# Patient Record
Sex: Male | Born: 1950 | Race: White | Hispanic: No | Marital: Married | Smoking: Former smoker
Health system: Southern US, Community
[De-identification: ages and names within clinical notes are randomized; demographics above are authoritative.]

## PROBLEM LIST (undated history)

## (undated) DIAGNOSIS — I10 Essential (primary) hypertension: Secondary | ICD-10-CM

## (undated) DIAGNOSIS — J449 Chronic obstructive pulmonary disease, unspecified: Secondary | ICD-10-CM

## (undated) HISTORY — PX: HERNIA REPAIR: SHX51

## (undated) HISTORY — PX: REPAIR OF PERFORATED ULCER: SHX6065

---

## 2016-05-07 ENCOUNTER — Inpatient Hospital Stay (HOSPITAL_COMMUNITY)
Admission: EM | Admit: 2016-05-07 | Discharge: 2016-05-11 | DRG: 469 | Disposition: A | Discharge status: Skilled Nursing Facility | Payer: Non-veteran care | Attending: Internal Medicine | Admitting: Internal Medicine

## 2016-05-07 ENCOUNTER — Emergency Department (HOSPITAL_COMMUNITY): Payer: Non-veteran care

## 2016-05-07 ENCOUNTER — Encounter (HOSPITAL_COMMUNITY): Payer: Self-pay | Admitting: *Deleted

## 2016-05-07 ENCOUNTER — Inpatient Hospital Stay (HOSPITAL_COMMUNITY): Payer: Non-veteran care

## 2016-05-07 DIAGNOSIS — Z419 Encounter for procedure for purposes other than remedying health state, unspecified: Secondary | ICD-10-CM

## 2016-05-07 DIAGNOSIS — J9601 Acute respiratory failure with hypoxia: Secondary | ICD-10-CM | POA: Diagnosis not present

## 2016-05-07 DIAGNOSIS — Z809 Family history of malignant neoplasm, unspecified: Secondary | ICD-10-CM

## 2016-05-07 DIAGNOSIS — S72011S Unspecified intracapsular fracture of right femur, sequela: Secondary | ICD-10-CM | POA: Diagnosis not present

## 2016-05-07 DIAGNOSIS — J449 Chronic obstructive pulmonary disease, unspecified: Secondary | ICD-10-CM | POA: Diagnosis present

## 2016-05-07 DIAGNOSIS — E871 Hypo-osmolality and hyponatremia: Secondary | ICD-10-CM | POA: Diagnosis present

## 2016-05-07 DIAGNOSIS — R000 Tachycardia, unspecified: Secondary | ICD-10-CM | POA: Diagnosis present

## 2016-05-07 DIAGNOSIS — S72011A Unspecified intracapsular fracture of right femur, initial encounter for closed fracture: Principal | ICD-10-CM | POA: Diagnosis present

## 2016-05-07 DIAGNOSIS — Y93K1 Activity, walking an animal: Secondary | ICD-10-CM | POA: Diagnosis not present

## 2016-05-07 DIAGNOSIS — F0390 Unspecified dementia without behavioral disturbance: Secondary | ICD-10-CM | POA: Diagnosis present

## 2016-05-07 DIAGNOSIS — Z79899 Other long term (current) drug therapy: Secondary | ICD-10-CM

## 2016-05-07 DIAGNOSIS — J439 Emphysema, unspecified: Secondary | ICD-10-CM | POA: Diagnosis not present

## 2016-05-07 DIAGNOSIS — Z7983 Long term (current) use of bisphosphonates: Secondary | ICD-10-CM | POA: Diagnosis not present

## 2016-05-07 DIAGNOSIS — Z8249 Family history of ischemic heart disease and other diseases of the circulatory system: Secondary | ICD-10-CM

## 2016-05-07 DIAGNOSIS — F17210 Nicotine dependence, cigarettes, uncomplicated: Secondary | ICD-10-CM | POA: Diagnosis present

## 2016-05-07 DIAGNOSIS — W010XXA Fall on same level from slipping, tripping and stumbling without subsequent striking against object, initial encounter: Secondary | ICD-10-CM | POA: Diagnosis present

## 2016-05-07 DIAGNOSIS — I10 Essential (primary) hypertension: Secondary | ICD-10-CM | POA: Diagnosis present

## 2016-05-07 DIAGNOSIS — I110 Hypertensive heart disease with heart failure: Secondary | ICD-10-CM | POA: Diagnosis present

## 2016-05-07 DIAGNOSIS — I509 Heart failure, unspecified: Secondary | ICD-10-CM | POA: Diagnosis present

## 2016-05-07 DIAGNOSIS — R413 Other amnesia: Secondary | ICD-10-CM | POA: Diagnosis not present

## 2016-05-07 DIAGNOSIS — Z96649 Presence of unspecified artificial hip joint: Secondary | ICD-10-CM

## 2016-05-07 DIAGNOSIS — E861 Hypovolemia: Secondary | ICD-10-CM | POA: Diagnosis present

## 2016-05-07 DIAGNOSIS — D62 Acute posthemorrhagic anemia: Secondary | ICD-10-CM | POA: Diagnosis not present

## 2016-05-07 HISTORY — DX: Essential (primary) hypertension: I10

## 2016-05-07 HISTORY — DX: Chronic obstructive pulmonary disease, unspecified: J44.9

## 2016-05-07 LAB — BASIC METABOLIC PANEL
ANION GAP: 11 (ref 5–15)
BUN: 11 mg/dL (ref 6–20)
CHLORIDE: 98 mmol/L — AB (ref 101–111)
CO2: 21 mmol/L — AB (ref 22–32)
Calcium: 9.1 mg/dL (ref 8.9–10.3)
Creatinine, Ser: 0.77 mg/dL (ref 0.61–1.24)
GFR calc Af Amer: >60 mL/min (ref >60)
GLUCOSE: 108 mg/dL — AB (ref 65–99)
POTASSIUM: 3.9 mmol/L (ref 3.5–5.1)
Sodium: 130 mmol/L — ABNORMAL LOW (ref 135–145)

## 2016-05-07 LAB — CBC WITH DIFFERENTIAL/PLATELET
BASOS ABS: 0 10*3/uL (ref 0.0–0.1)
Basophils Relative: 0 %
Eosinophils Absolute: 0 10*3/uL (ref 0.0–0.7)
Eosinophils Relative: 0 %
HEMATOCRIT: 38.2 % — AB (ref 39.0–52.0)
HEMOGLOBIN: 13.1 g/dL (ref 13.0–17.0)
LYMPHS ABS: 1.4 10*3/uL (ref 0.7–4.0)
LYMPHS PCT: 14 %
MCH: 31.9 pg (ref 26.0–34.0)
MCHC: 34.3 g/dL (ref 30.0–36.0)
MCV: 92.9 fL (ref 78.0–100.0)
Monocytes Absolute: 0.7 10*3/uL (ref 0.1–1.0)
Monocytes Relative: 7 %
NEUTROS ABS: 7.7 10*3/uL (ref 1.7–7.7)
Neutrophils Relative %: 79 %
Platelets: 226 10*3/uL (ref 150–400)
RBC: 4.11 MIL/uL — AB (ref 4.22–5.81)
RDW: 13.9 % (ref 11.5–15.5)
WBC: 9.8 10*3/uL (ref 4.0–10.5)

## 2016-05-07 MED ORDER — FENTANYL CITRATE (PF) 100 MCG/2ML IJ SOLN
50.0000 ug | Freq: Once | INTRAMUSCULAR | Status: AC
  Administered 2016-05-07: 50 ug via INTRAVENOUS
  Filled 2016-05-07: qty 2

## 2016-05-07 MED ORDER — MORPHINE SULFATE (PF) 2 MG/ML IV SOLN
0.5000 mg | INTRAVENOUS | Status: DC | PRN
  Filled 2016-05-07: qty 1

## 2016-05-07 MED ORDER — DOCUSATE SODIUM 100 MG PO CAPS
100.0000 mg | ORAL_CAPSULE | Freq: Two times a day (BID) | ORAL | Status: DC
  Administered 2016-05-08 – 2016-05-11 (×6): 100 mg via ORAL
  Filled 2016-05-07 (×6): qty 1

## 2016-05-07 MED ORDER — BISACODYL 10 MG RE SUPP: 10.0000 mg | Freq: Every day | RECTAL | Status: DC | PRN

## 2016-05-07 MED ORDER — HYDROMORPHONE HCL 1 MG/ML IJ SOLN
0.5000 mg | INTRAMUSCULAR | Status: DC | PRN
  Filled 2016-05-07: qty 1

## 2016-05-07 MED ORDER — MORPHINE SULFATE (PF) 2 MG/ML IV SOLN
2.0000 mg | INTRAVENOUS | Status: DC | PRN
  Administered 2016-05-07: 2 mg via INTRAVENOUS
  Filled 2016-05-07: qty 1

## 2016-05-07 MED ORDER — HYDROCODONE-ACETAMINOPHEN 5-325 MG PO TABS: 1.0000 | ORAL_TABLET | Freq: Four times a day (QID) | ORAL | Status: DC | PRN

## 2016-05-07 MED ORDER — OXYCODONE-ACETAMINOPHEN 5-325 MG PO TABS
1.0000 | ORAL_TABLET | Freq: Once | ORAL | Status: AC
  Administered 2016-05-07: 1 via ORAL

## 2016-05-07 MED ORDER — FENTANYL CITRATE (PF) 100 MCG/2ML IJ SOLN: 50.0000 ug | Freq: Once | INTRAMUSCULAR | Status: DC

## 2016-05-07 MED ORDER — TIOTROPIUM BROMIDE MONOHYDRATE 18 MCG IN CAPS
18.0000 ug | ORAL_CAPSULE | Freq: Every day | RESPIRATORY_TRACT | Status: DC
  Administered 2016-05-09 – 2016-05-11 (×3): 18 ug via RESPIRATORY_TRACT
  Filled 2016-05-07: qty 5

## 2016-05-07 MED ORDER — OXYCODONE-ACETAMINOPHEN 5-325 MG PO TABS
ORAL_TABLET | ORAL | Status: AC
  Filled 2016-05-07: qty 1

## 2016-05-07 MED ORDER — POLYETHYLENE GLYCOL 3350 17 G PO PACK: 17.0000 g | PACK | Freq: Every day | ORAL | Status: DC | PRN

## 2016-05-07 NOTE — ED Triage Notes (Signed)
Pt reports falling while walking the dog. Having right hip pain and multiple small abrasions to right hand and arm.

## 2016-05-07 NOTE — ED Provider Notes (Signed)
[facility]EMERGENCY DEPT Provider Note   CSN: [id number] Arrival date & time: 05/07/16  1630  First Provider Contact:  None       History   Chief Complaint Chief Complaint  Patient presents with  . Fall    HPI Julian Mejia is a 65 y.o. male.   Hip Pain  This is a new problem. The current episode started 3 to 5 hours ago. The problem occurs constantly. Progression since onset: comes and goes. Pertinent negatives include no chest pain, no abdominal pain, no headaches and no shortness of breath. The symptoms are aggravated by walking and standing (palpation). The symptoms are relieved by rest and narcotics. He has tried rest for the symptoms. The treatment provided mild relief.    Past Medical History:  Diagnosis Date  . COPD (chronic obstructive pulmonary disease) (HCC)   . Hypertension     Patient Active Problem List   Diagnosis Date Noted  . Memory loss 05/08/2016  . Hyponatremia 05/08/2016  . Closed subcapital fracture of neck of right femur (HCC) 05/07/2016  . Essential hypertension 05/07/2016  . Chronic obstructive pulmonary disease (COPD) (HCC) 05/07/2016    Past Surgical History:  Procedure Laterality Date  . HERNIA REPAIR    . REPAIR OF PERFORATED ULCER         Home Medications    Prior to Admission medications   Medication Sig Start Date End Date Taking? Authorizing Provider  albuterol (PROVENTIL HFA;VENTOLIN HFA) 108 (90 Base) MCG/ACT inhaler Inhale into the lungs every 6 (six) hours as needed for wheezing or shortness of breath.   Yes Historical Provider, MD  alendronate (FOSAMAX) 70 MG tablet Take 70 mg by mouth once a week. Take with a full glass of water on an empty stomach. 05/08/16  Yes Historical Provider, MD  amLODipine (NORVASC) 5 MG tablet Take 10 mg by mouth daily.   Yes Historical Provider, MD  cholecalciferol (VITAMIN D) 1000 units tablet Take 1,000 Units by mouth daily.   Yes Historical Provider, MD  donepezil (ARICEPT) 5 MG tablet Take 10 mg by  mouth daily.   Yes Historical Provider, MD  losartan (COZAAR) 50 MG tablet Take 50 mg by mouth daily.   Yes Historical Provider, MD  metoprolol (LOPRESSOR) 50 MG tablet Take 50 mg by mouth daily.   Yes Historical Provider, MD  tiotropium (SPIRIVA) 18 MCG inhalation capsule Place 18 mcg into inhaler and inhale daily.   Yes Historical Provider, MD    Family History Family History  Problem Relation Age of Onset  . Cancer Mother   . Coronary artery disease Father     Social History Social History  Substance Use Topics  . Smoking status: Current Every Day Smoker    Packs/day: 1.00    Types: Cigarettes  . Smokeless tobacco: Never Used  . Alcohol use Yes     Comment: daily     Allergies   Review of patient's allergies indicates no known allergies.   Review of Systems Review of Systems  Respiratory: Negative for shortness of breath.   Cardiovascular: Negative for chest pain.  Gastrointestinal: Negative for abdominal pain.  Neurological: Negative for headaches.     Physical Exam Updated Vital Signs BP 124/85 (BP Location: Left Arm)   Pulse 88   Temp 98.7 F (37.1 C) (Oral)   Resp 18   SpO2 98%   Physical Exam  Constitutional: He is oriented to person, place, and time. He appears well-developed. He appears distressed (occasionally has severe pain in  his R hip).  Thin pleasant  HENT:  Head: Normocephalic and atraumatic.  Eyes: Conjunctivae are normal. Right eye exhibits no discharge. Left eye exhibits no discharge.  Neck: Normal range of motion. Neck supple. No tracheal deviation present.  Cardiovascular: Normal rate, regular rhythm, normal heart sounds and intact distal pulses.  Exam reveals no gallop and no friction rub.   No murmur heard. Pulmonary/Chest: Effort normal. No stridor. No respiratory distress. He has wheezes (mild scattered wheezing). He has no rales.  Abdominal: Soft. Bowel sounds are normal. He exhibits no distension. There is no tenderness. There is  no rebound and no guarding.  Musculoskeletal: He exhibits tenderness (to r hip. pelvis appears stable though). He exhibits no edema or deformity.  Foot was in neutral position.  R leg is mildly shortened and ext rotated  Neurological: He is alert and oriented to person, place, and time.  MAEI Movement in RLE is limited due to pain in hip  Skin: Skin is warm and dry. Capillary refill takes less than 2 seconds. He is not diaphoretic.  Psychiatric: He has a normal mood and affect. His behavior is normal. Thought content normal.  Nursing note and vitals reviewed.    ED Treatments / Results  Labs (all labs ordered are listed, but only abnormal results are displayed) Labs Reviewed  CBC WITH DIFFERENTIAL/PLATELET - Abnormal; Notable for the following:       Result Value   RBC 4.11 (*)    HCT 38.2 (*)    All other components within normal limits  BASIC METABOLIC PANEL - Abnormal; Notable for the following:    Sodium 130 (*)    Chloride 98 (*)    CO2 21 (*)    Glucose, Bld 108 (*)    All other components within normal limits  CBC - Abnormal; Notable for the following:    RBC 4.18 (*)    HCT 38.8 (*)    All other components within normal limits  BASIC METABOLIC PANEL - Abnormal; Notable for the following:    Sodium 134 (*)    Glucose, Bld 111 (*)    All other components within normal limits  SURGICAL PCR SCREEN  SODIUM, URINE, RANDOM  OSMOLALITY, URINE  OSMOLALITY  VITAMIN D 25 HYDROXY (VIT D DEFICIENCY, FRACTURES)  TYPE AND SCREEN  ABO/RH    EKG  EKG Interpretation  Date/Time:  Thursday May 07 2016 22:56:52 EDT Ventricular Rate:  86 PR Interval:    QRS Duration: 84 QT Interval:  347 QTC Calculation: 415 R Axis:   -141 Text Interpretation:  Sinus rhythm Biatrial enlargement Right axis deviation No prior EKG for comparison  Confirmed by LIU MD, DANA [phone]) on 05/07/2016 11:27:25 PM       Radiology Chest Portable 1 View  Result Date: 05/07/2016 CLINICAL DATA:   66 year old male preoperative study for hip fracture. Initial encounter. COPD EXAM: PORTABLE CHEST 1 VIEW COMPARISON:  07/27/2009. FINDINGS: Portable AP semi upright view at 2256 hours. Increased and moderate to severe tortuosity of the thoracic aorta. Other mediastinal contours are within normal limits. Visualized tracheal air column is within normal limits. Allowing for portable technique, the lungs are clear. Chronic hyperinflation. No pneumothorax or pleural effusion. IMPRESSION: 1.  No acute cardiopulmonary abnormality.  Pulmonary hyperinflation. 2. Significantly progressed tortuosity of the thoracic aorta since 2010. Consider non-emergent imaging followup by CTA or MRA. Electronically Signed   By: Odessa Fleming M.D.   On: 05/07/2016 23:10   Dg Hip Unilat  With Pelvis  2-3 Views Right  Result Date: 05/07/2016 CLINICAL DATA:  Initial evaluation for acute trauma, fall. Acute right-sided hip pain. EXAM: DG HIP (WITH OR WITHOUT PELVIS) 2-3V RIGHT COMPARISON:  None. FINDINGS: There is an acute subcapital fracture through the right femoral neck with slight impaction. Femoral head remains normally position within the acetabulum. Visualized right femur are otherwise intact. Bony pelvis intact without associated fracture. The SI joints approximated. Limited views of the left hip grossly unremarkable. 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 Scattered vascular calcifications noted within the proximal thighs. IMPRESSION: Acute subcapital fracture of the right femoral neck with slight impaction. Electronically Signed   By: Rise Mu M.D.   On: 05/07/2016 19:18    Procedures Procedures (including critical care time)  Medications Ordered in ED Medications  fentaNYL (SUBLIMAZE) injection 50 mcg (not administered)  oxyCODONE-acetaminophen (PERCOCET/ROXICET) 5-325 MG per tablet 1 tablet (1 tablet Oral Given 05/07/16 1701)     Initial Impression / Assessment and Plan / ED Course  I  have reviewed the triage vital signs and the nursing notes.  Pertinent labs & imaging results that were available during my care of the patient were reviewed by me and considered in my medical decision making (see chart for details).  Clinical Course   65 year old male who presents after a mechanical fall while walking his dog. Denies any preceding SOB/CP/syncope. Past medical history includes smoking and hypertension. Patient had immediate pain along his right proximal femur/right hip. Abrasions to his digits but otherwise no other pain or visible trauma. Exquisitely tender to palpation and worse with movement. Patient is neurovascularly intact distally. X-ray of his right hip was obtained and revealed a closed subcapital fracture of the neck of the right femur. Analgesia was also provided initially with fentanyl due to soft blood pressures, however with transition to a more long-acting medication including Percocet and morphine for breakthrough once blood pressure improved. Orthopedics was consulted and they recommended admission with appropriate preop screening in addition to making the patient NPO at midnight for possible surgical repair in the morning. Discussed case with the hospitalist team. Patient and wife updated at bedside. Agree with plan. All questions answered.  Final Clinical Impressions(s) / ED Diagnoses   Final diagnoses:  COPD (chronic obstructive pulmonary disease) (HCC)  Closed subcapital fracture of neck of right femur   New Prescriptions New Prescriptions   No medications on file      Maretta Bees, MD 05/08/16 2863    Lavera Guise, MD 05/08/16 1102

## 2016-05-07 NOTE — ED Notes (Signed)
MD at bedside. 

## 2016-05-07 NOTE — ED Notes (Signed)
XR at bedside

## 2016-05-07 NOTE — H&P (Signed)
History and Physical  Patient Name: Julian Mejia     YSA:[id number]    DOB: 08-28-1951    DOA: 05/07/2016 Referring provider: Glee Arvin, MD PCP: "Dr. Virgia Land" or "Dr. Jeri Lager" at the [facility]  Patient coming from: Home     Chief Complaint: Hip pain and fall  HPI: Julian Mejia is a 65 y.o. male with a past medical history significant for COPD, HTN, early dementia, and smoking who presents with hip pain after a fall.  The patient was in his normal state of health until this evening when he was walking the dog, was pulled down, and had immediate RIGHT hip pain and could not walk.  A neighbor helped him onto or into his truck, then his wife drove him to the [place] where they said he needed to go to a regular hospital, and so he came here.  In the ED, the patient was slightly tachycardic, but otherwise stable, complained of RIGHT hip pain severe and a plain radiograph of the pelvis showed a acute subcapital fracture of the right femoral neck.  The case was discussed with Dr. Linwood Dibbles who agreed to see the patient, and TR[facility] were asked to admit for medical management.    There was no preceding dizziness, weakness, lightheadedness or vertigo, no chest discomfort, palpitations, or dyspnea, nor are there any of those symptoms now.  The patient denies active heart issues, angina, or history of MI. He is able to exert to an equivalent of 4 METS without dyspnea.  He denies history of TIAs/CVAs, CAD, CHF, or DM treated with insulin. He has COPD on tiotropium, but has never been hospitalized for COPD or been intubated.  He is an active smoker.  She has no history of prolonged steroid use and does not use insulin.  He uses alcohol daily, 3-4 beers "or whatever it is".  Anesthesia Specific concerns: Presence of loose teeth: None Anesthesia problems in past: None History of bleeding disorder: None  Review of Systems:  Pt complains of RIGHT hip pain with movement. All other systems negative except as just noted or  noted in the history of present illness.        Past Medical History:  Diagnosis Date  . COPD (chronic obstructive pulmonary disease) (HCC)   . Hypertension     Past Surgical History:  Procedure Laterality Date  . HERNIA REPAIR    . REPAIR OF PERFORATED ULCER      Social History: Patient lives with his wife.  Patient walks unassisted. 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  From [place] area.  Still smokes.  Drinks several beers daily.     No Known Allergies  Family history: family history includes Cancer in his mother; Coronary artery disease in his father.  Prior to Admission medications   Not on File  Wife reports he takes "three blood pressure medicines", but not sure what they are.   Also takes tiotropium daily and Proair as needed.   Also takes a "medicine for memory"     Physical Exam: BP 122/88   Pulse 87   Temp 98.7 F (37.1 C) (Oral)   Resp 18   SpO2 91%  General appearance: Very thin adult male, alert and in no acute distress.   Eyes: Anicteric, conjunctiva pink, lids and lashes normal.     ENT: No nasal deformity, discharge, or epistaxis.  OP moist without lesions.   Skin: Warm and dry.  No jaundice.  No suspicious rashes or lesions. Cardiac: RRR, nl S1-S2, no murmurs appreciated.  Capillary refill is brisk.  JVP normal.  No LE edema.  Radial and DP pulses 2+ and symmetric. Respiratory: Normal respiratory rate and rhythm.  CTAB without rales, some rhonchi, mild. GI: Abdomen soft without rigidity.  No TTP. No ascites, distension.   MSK: Right leg is shortened and externally rotated and painful. Neuro: Cranial nerves normal.  Sensorium intact and responding to questions, attention normal.  Speech is fluent.  Moves upper extremities equally and with normal coordination.   Memory and recall seem slightly impaired. Psych: Behavior appropriate.  Affect normal, pleasant.  No evidence of aural or visual hallucinations or  delusions.       Labs on Admission:  I have personally reviewed following labs and imaging studies: CBC:  Recent Labs Lab 05/07/16 2250 05/08/16 0026  WBC 9.8 8.7  NEUTROABS 7.7  --   HGB 13.1 13.2  HCT 38.2* 38.8*  MCV 92.9 92.8  PLT 226 234   Basic Metabolic Panel:  Recent Labs Lab 05/07/16 2250 05/08/16 0026  NA 130* 134*  K 3.9 3.9  CL 98* 101  CO2 21* 23  GLUCOSE 108* 111*  BUN 11 11  CREATININE 0.77 0.78  CALCIUM 9.1 9.3   GFR: CrCl cannot be calculated (Unknown ideal weight.).    Radiological Exams on Admission: Personally reviewed: Chest Portable 1 View  Result Date: 05/07/2016 CLINICAL DATA:  65 year old male preoperative study for hip fracture. Initial encounter. COPD EXAM: PORTABLE CHEST 1 VIEW COMPARISON:  07/27/2009. FINDINGS: Portable AP semi upright view at 2256 hours. Increased and moderate to severe tortuosity of the thoracic aorta. Other mediastinal contours are within normal limits. Visualized tracheal air column is within normal limits. Allowing for portable technique, the lungs are clear. Chronic hyperinflation. No pneumothorax or pleural effusion. IMPRESSION: 1.  No acute cardiopulmonary abnormality.  Pulmonary hyperinflation. 2. Significantly progressed tortuosity of the thoracic aorta since 2010. Consider non-emergent imaging followup by CTA or MRA. Electronically Signed   By: Odessa Fleming M.D.   On: 05/07/2016 23:10   Dg Hip Unilat  With Pelvis 2-3 Views Right  Result Date: 05/07/2016 CLINICAL DATA:  Initial evaluation for acute trauma, fall. Acute right-sided hip pain. EXAM: DG HIP (WITH OR WITHOUT PELVIS) 2-3V RIGHT COMPARISON:  None. FINDINGS: There is an acute subcapital fracture through the right femoral neck with slight impaction. Femoral head remains normally position within the acetabulum. Visualized right femur are otherwise intact. Bony pelvis intact without associated fracture. The SI joints approximated. Limited views of the left hip grossly  unremarkable. 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 Scattered vascular calcifications noted within the proximal thighs. IMPRESSION: Acute subcapital fracture of the right femoral neck with slight impaction. Electronically Signed   By: Rise Mu M.D.   On: 05/07/2016 19:18    EKG: Independently reviewed. Rate 86, QTc 415, right axis, incomplete RBBB.    Assessment and Plan: 1. Hip fracture: The patient will be seen by Dr. Roda Shutters in the morning at [facility], to evaluate for operative fixation of the RIGHT hip.   -Admit to med-surg bed -Hydrocodone-acetaminophen or morphine as tolerated for pain -Bed rest, apply ice, document sedation and vitals per Hip fracture protocol -NPO at midnight -MIVF -Nutrition consulted    Overall, the patient is at low risk for the planned surgery.  Patient has a RCRI score of 0 (active cardiac condition, CHF, CAD, DM treated with insulin, TIA/CVA, Cr > 2.0). The  patient has no active cardiac symptoms, a functional capacity >4 METs, and would be expected to be at average risk for cardiac complications from this intermediate risk procedure. -No further testing needed  Pulmonary: Patient has COPD and is an active smoker.  CXR shows emphysema but he is not hypoxic nor in respiratory distress. Patient is at moderate risk for pulmonary complications.  -Continue tiotropium -Albuterol PRN  Endocrine: Patient has no history of steroid use or DM.  Heme: Transfusion threshold 8 mg/dL.   2. HTN: Unclear what medicines patient takes.  Normotensive now.  Ideally would continue beta-blocker peri-operatively.  -I have asked the patient's wife to go home and call me tonight with his medication list, otherwise, she will return with them tomorrow   Post-operative medical care: Per AAOS 2014 guidelines on hip fractures in the elderly: -Recommend osteoporosis screening after discharge if not done previously -Recommend vitamin D 800 IUand  calcium 1200 mg supplementation after discharge    3. Hyponatremia: Previous level low, likely hypovolemic. -Check free water clearance and urine sodium  4. Aortic tortuosity: Will confirm PCP name (wife thought it was "[place]" or "Jeri Lager" when she returns call with medication list -Follow up with CTA or MR imaging as outpatient per Radiology  5. Alcohol use: Unclear extent. -CIWA protocol perioperatively            DVT prophylaxis: SCDs  Diet: NPO Code Status: FULL  Family Communication: Wife  Disposition Plan: Anticipate evaluation by Orthopedics and likely surgical fixation tomorrow, then PT evaluation and discharge to SNF in 2-3 days. Admission status: INPATIENT for hip fracture, medical surgical bed     Medical decision making and consults: Patient seen at 10:00 PM on 05/07/2016.  The patient was discussed with Dr. Verdie Mosher. Clinical condition: stable.      Alberteen Sam [facility] Pager [phone]

## 2016-05-07 NOTE — ED Notes (Signed)
Attempt to call report unsuccessful at this time. Nurse requested to be called back in fifteen minutes.

## 2016-05-07 NOTE — ED Provider Notes (Signed)
I saw and evaluated the patient, reviewed the resident's note and I agree with the findings and plan.   EKG Interpretation None       I have independently reviewed the following tracings and/or images and used them in my medical decision making: XR right hip and pelvis   65 year old male who presents after mechanical fall. No blood thinners. No significant PMH. His dog pulled on the leash today during a walk, and he fell onto the right hip. Unable to stand or ambulate after fall. No headstrike or LOC. No chest pain, abd pain, neck or back pain. C/o right hip pain. Neurovascularly in tact RLE. No other injuries noted on exam aside from right hip tenderness. XR with right femoral neck fracture. Dr. Roda Shutters will plan to operate tomorrow. Admitted to Dr. Maryfrances Bunnell for ongoing management.      Lavera Guise, MD 05/07/16 [phone]

## 2016-05-08 ENCOUNTER — Encounter (HOSPITAL_COMMUNITY)
Admission: EM | Disposition: A | Discharge status: Skilled Nursing Facility | Payer: Self-pay | Source: Home / Self Care | Attending: Internal Medicine

## 2016-05-08 ENCOUNTER — Encounter (HOSPITAL_COMMUNITY): Payer: Self-pay

## 2016-05-08 ENCOUNTER — Inpatient Hospital Stay (HOSPITAL_COMMUNITY): Payer: Non-veteran care

## 2016-05-08 ENCOUNTER — Inpatient Hospital Stay (HOSPITAL_COMMUNITY): Payer: Non-veteran care | Admitting: Certified Registered Nurse Anesthetist

## 2016-05-08 DIAGNOSIS — E871 Hypo-osmolality and hyponatremia: Secondary | ICD-10-CM | POA: Diagnosis present

## 2016-05-08 DIAGNOSIS — R413 Other amnesia: Secondary | ICD-10-CM | POA: Diagnosis present

## 2016-05-08 DIAGNOSIS — S72011S Unspecified intracapsular fracture of right femur, sequela: Secondary | ICD-10-CM

## 2016-05-08 HISTORY — PX: TOTAL HIP ARTHROPLASTY: SHX124

## 2016-05-08 LAB — BASIC METABOLIC PANEL
Anion gap: 10 (ref 5–15)
BUN: 11 mg/dL (ref 6–20)
CALCIUM: 9.3 mg/dL (ref 8.9–10.3)
CO2: 23 mmol/L (ref 22–32)
CREATININE: 0.78 mg/dL (ref 0.61–1.24)
Chloride: 101 mmol/L (ref 101–111)
GFR calc Af Amer: >60 mL/min (ref >60)
GLUCOSE: 111 mg/dL — AB (ref 65–99)
Potassium: 3.9 mmol/L (ref 3.5–5.1)
Sodium: 134 mmol/L — ABNORMAL LOW (ref 135–145)

## 2016-05-08 LAB — OSMOLALITY: OSMOLALITY: 282 mosm/kg (ref 275–295)

## 2016-05-08 LAB — CBC
HCT: 38.8 % — ABNORMAL LOW (ref 39.0–52.0)
Hemoglobin: 13.2 g/dL (ref 13.0–17.0)
MCH: 31.6 pg (ref 26.0–34.0)
MCHC: 34 g/dL (ref 30.0–36.0)
MCV: 92.8 fL (ref 78.0–100.0)
PLATELETS: 234 10*3/uL (ref 150–400)
RBC: 4.18 MIL/uL — AB (ref 4.22–5.81)
RDW: 14.1 % (ref 11.5–15.5)
WBC: 8.7 10*3/uL (ref 4.0–10.5)

## 2016-05-08 LAB — TYPE AND SCREEN
ABO/RH(D): A NEG
Antibody Screen: NEGATIVE

## 2016-05-08 LAB — SURGICAL PCR SCREEN
MRSA, PCR: NEGATIVE
STAPHYLOCOCCUS AUREUS: NEGATIVE

## 2016-05-08 LAB — SODIUM, URINE, RANDOM: Sodium, Ur: 74 mmol/L

## 2016-05-08 LAB — ABO/RH: ABO/RH(D): A NEG

## 2016-05-08 LAB — OSMOLALITY, URINE: Osmolality, Ur: 582 mOsm/kg (ref 300–900)

## 2016-05-08 SURGERY — ARTHROPLASTY, HIP, TOTAL, ANTERIOR APPROACH
Anesthesia: Spinal | Laterality: Right

## 2016-05-08 MED ORDER — THIAMINE HCL 100 MG/ML IJ SOLN: 100.0000 mg | Freq: Every day | INTRAMUSCULAR | Status: DC

## 2016-05-08 MED ORDER — METOCLOPRAMIDE HCL 5 MG PO TABS
5.0000 mg | ORAL_TABLET | Freq: Three times a day (TID) | ORAL | Status: DC | PRN
Start: 2016-05-08 — End: 2016-05-11

## 2016-05-08 MED ORDER — VITAMIN B-1 100 MG PO TABS
100.0000 mg | ORAL_TABLET | Freq: Every day | ORAL | Status: DC
Start: 2016-05-08 — End: 2016-05-11
  Administered 2016-05-09 – 2016-05-11 (×3): 100 mg via ORAL
  Filled 2016-05-08 (×3): qty 1

## 2016-05-08 MED ORDER — SODIUM CHLORIDE 0.9 % IV SOLN
INTRAVENOUS | Status: DC
  Administered 2016-05-08 – 2016-05-09 (×4): via INTRAVENOUS

## 2016-05-08 MED ORDER — PROPOFOL 500 MG/50ML IV EMUL
INTRAVENOUS | Status: DC | PRN
  Administered 2016-05-08: 50 ug/kg/min via INTRAVENOUS

## 2016-05-08 MED ORDER — PHENYLEPHRINE 40 MCG/ML (10ML) SYRINGE FOR IV PUSH (FOR BLOOD PRESSURE SUPPORT)
PREFILLED_SYRINGE | INTRAVENOUS | Status: AC
  Filled 2016-05-08: qty 10

## 2016-05-08 MED ORDER — BUPIVACAINE LIPOSOME 1.3 % IJ SUSP
20.0000 mL | Freq: Once | INTRAMUSCULAR | Status: DC
  Filled 2016-05-08: qty 20

## 2016-05-08 MED ORDER — PHENYLEPHRINE HCL 10 MG/ML IJ SOLN
INTRAMUSCULAR | Status: DC | PRN
  Administered 2016-05-08: 80 ug via INTRAVENOUS

## 2016-05-08 MED ORDER — MIDAZOLAM HCL 2 MG/2ML IJ SOLN
INTRAMUSCULAR | Status: AC
  Filled 2016-05-08: qty 2

## 2016-05-08 MED ORDER — ALBUTEROL SULFATE (2.5 MG/3ML) 0.083% IN NEBU
INHALATION_SOLUTION | RESPIRATORY_TRACT | Status: AC
  Filled 2016-05-08: qty 3

## 2016-05-08 MED ORDER — LIDOCAINE HCL (CARDIAC) 20 MG/ML IV SOLN
INTRAVENOUS | Status: DC | PRN
  Administered 2016-05-08: 40 mg via INTRAVENOUS

## 2016-05-08 MED ORDER — ALBUTEROL SULFATE (2.5 MG/3ML) 0.083% IN NEBU
2.5000 mg | INHALATION_SOLUTION | RESPIRATORY_TRACT | Status: DC | PRN
  Administered 2016-05-08: 2.5 mg via RESPIRATORY_TRACT

## 2016-05-08 MED ORDER — ONDANSETRON HCL 4 MG/2ML IJ SOLN: 4.0000 mg | Freq: Once | INTRAMUSCULAR | Status: DC | PRN

## 2016-05-08 MED ORDER — LORAZEPAM 1 MG PO TABS: 1.0000 mg | ORAL_TABLET | Freq: Four times a day (QID) | ORAL | Status: AC | PRN

## 2016-05-08 MED ORDER — HYDROCODONE-ACETAMINOPHEN 5-325 MG PO TABS: 1.0000 | ORAL_TABLET | Freq: Four times a day (QID) | ORAL | Status: DC | PRN

## 2016-05-08 MED ORDER — ENOXAPARIN SODIUM 40 MG/0.4ML ~~LOC~~ SOLN: 40.0000 mg | Freq: Every day | SUBCUTANEOUS | 0 refills | Status: DC

## 2016-05-08 MED ORDER — METOCLOPRAMIDE HCL 5 MG/ML IJ SOLN
5.0000 mg | Freq: Three times a day (TID) | INTRAMUSCULAR | Status: DC | PRN
Start: 2016-05-08 — End: 2016-05-11

## 2016-05-08 MED ORDER — ACETAMINOPHEN 325 MG PO TABS
650.0000 mg | ORAL_TABLET | Freq: Four times a day (QID) | ORAL | Status: DC | PRN
  Administered 2016-05-10: 650 mg via ORAL
  Filled 2016-05-08: qty 2

## 2016-05-08 MED ORDER — ONDANSETRON HCL 4 MG/2ML IJ SOLN
INTRAMUSCULAR | Status: AC
  Filled 2016-05-08: qty 2

## 2016-05-08 MED ORDER — LORAZEPAM 2 MG/ML IJ SOLN: 1.0000 mg | Freq: Four times a day (QID) | INTRAMUSCULAR | Status: AC | PRN

## 2016-05-08 MED ORDER — LACTATED RINGERS IV SOLN
INTRAVENOUS | Status: DC
  Administered 2016-05-08 (×2): via INTRAVENOUS

## 2016-05-08 MED ORDER — SODIUM CHLORIDE 0.9 % IV SOLN
INTRAVENOUS | Status: DC | PRN
  Administered 2016-05-08: 20 ug/min via INTRAVENOUS

## 2016-05-08 MED ORDER — CEFAZOLIN SODIUM-DEXTROSE 2-3 GM-% IV SOLR: INTRAVENOUS | Status: DC | PRN

## 2016-05-08 MED ORDER — ENOXAPARIN SODIUM 40 MG/0.4ML ~~LOC~~ SOLN
40.0000 mg | SUBCUTANEOUS | Status: DC
  Administered 2016-05-09 – 2016-05-11 (×3): 40 mg via SUBCUTANEOUS
  Filled 2016-05-08 (×3): qty 0.4

## 2016-05-08 MED ORDER — DONEPEZIL HCL 10 MG PO TABS
10.0000 mg | ORAL_TABLET | Freq: Every day | ORAL | Status: DC
  Administered 2016-05-09 – 2016-05-10 (×3): 10 mg via ORAL
  Filled 2016-05-08 (×3): qty 1

## 2016-05-08 MED ORDER — POVIDONE-IODINE 10 % EX SWAB: 2.0000 "application " | Freq: Once | CUTANEOUS | Status: DC

## 2016-05-08 MED ORDER — BUPIVACAINE HCL (PF) 0.5 % IJ SOLN
INTRAMUSCULAR | Status: DC | PRN
  Administered 2016-05-08: 12.5 mg via INTRATHECAL

## 2016-05-08 MED ORDER — ADULT MULTIVITAMIN W/MINERALS CH
1.0000 | ORAL_TABLET | Freq: Every day | ORAL | Status: DC
  Administered 2016-05-09 – 2016-05-11 (×3): 1 via ORAL
  Filled 2016-05-08 (×3): qty 1

## 2016-05-08 MED ORDER — DIPHENHYDRAMINE HCL 25 MG PO CAPS
25.0000 mg | ORAL_CAPSULE | Freq: Three times a day (TID) | ORAL | Status: DC | PRN
  Administered 2016-05-08: 25 mg via ORAL
  Filled 2016-05-08: qty 1

## 2016-05-08 MED ORDER — CEFAZOLIN SODIUM 1 G IJ SOLR
INTRAMUSCULAR | Status: DC | PRN
  Administered 2016-05-08: 2 g via INTRAMUSCULAR

## 2016-05-08 MED ORDER — METHOCARBAMOL 500 MG PO TABS
500.0000 mg | ORAL_TABLET | Freq: Four times a day (QID) | ORAL | Status: DC | PRN
  Administered 2016-05-08 – 2016-05-09 (×3): 500 mg via ORAL
  Filled 2016-05-08 (×4): qty 1

## 2016-05-08 MED ORDER — TRANEXAMIC ACID 1000 MG/10ML IV SOLN
2000.0000 mg | Freq: Once | INTRAVENOUS | Status: DC
  Filled 2016-05-08: qty 20

## 2016-05-08 MED ORDER — CEFAZOLIN SODIUM-DEXTROSE 2-4 GM/100ML-% IV SOLN: 2.0000 g | INTRAVENOUS | Status: DC

## 2016-05-08 MED ORDER — AMLODIPINE BESYLATE 10 MG PO TABS
10.0000 mg | ORAL_TABLET | Freq: Every day | ORAL | Status: DC
  Administered 2016-05-09 – 2016-05-11 (×3): 10 mg via ORAL
  Filled 2016-05-08 (×3): qty 1

## 2016-05-08 MED ORDER — FENTANYL CITRATE (PF) 100 MCG/2ML IJ SOLN
INTRAMUSCULAR | Status: DC | PRN
  Administered 2016-05-08: 50 ug via INTRAVENOUS

## 2016-05-08 MED ORDER — FENTANYL CITRATE (PF) 250 MCG/5ML IJ SOLN
INTRAMUSCULAR | Status: AC
  Filled 2016-05-08: qty 5

## 2016-05-08 MED ORDER — OXYCODONE HCL 5 MG PO TABS: 5.0000 mg | ORAL_TABLET | ORAL | 0 refills | Status: DC | PRN

## 2016-05-08 MED ORDER — ONDANSETRON HCL 4 MG/2ML IJ SOLN: 4.0000 mg | Freq: Four times a day (QID) | INTRAMUSCULAR | Status: DC | PRN

## 2016-05-08 MED ORDER — PROPOFOL 10 MG/ML IV BOLUS
INTRAVENOUS | Status: DC | PRN
  Administered 2016-05-08: 20 mg via INTRAVENOUS
  Administered 2016-05-08: 10 mg via INTRAVENOUS

## 2016-05-08 MED ORDER — FOLIC ACID 1 MG PO TABS
1.0000 mg | ORAL_TABLET | Freq: Every day | ORAL | Status: DC
  Administered 2016-05-09 – 2016-05-11 (×3): 1 mg via ORAL
  Filled 2016-05-08 (×3): qty 1

## 2016-05-08 MED ORDER — METOPROLOL TARTRATE 50 MG PO TABS
50.0000 mg | ORAL_TABLET | Freq: Every day | ORAL | Status: DC
  Administered 2016-05-09 – 2016-05-11 (×3): 50 mg via ORAL
  Filled 2016-05-08 (×3): qty 1

## 2016-05-08 MED ORDER — ONDANSETRON HCL 4 MG PO TABS: 4.0000 mg | ORAL_TABLET | Freq: Four times a day (QID) | ORAL | Status: DC | PRN

## 2016-05-08 MED ORDER — ALUM & MAG HYDROXIDE-SIMETH 200-200-20 MG/5ML PO SUSP: 30.0000 mL | ORAL | Status: DC | PRN

## 2016-05-08 MED ORDER — SODIUM CHLORIDE 0.9 % IN NEBU
INHALATION_SOLUTION | RESPIRATORY_TRACT | Status: AC
  Filled 2016-05-08: qty 3

## 2016-05-08 MED ORDER — SODIUM CHLORIDE 0.9 % IV SOLN
INTRAVENOUS | Status: DC | PRN
  Administered 2016-05-08: 2000 mg via TOPICAL

## 2016-05-08 MED ORDER — DEXTROSE 5 % IV SOLN: 3.0000 g | INTRAVENOUS | Status: DC

## 2016-05-08 MED ORDER — METHOCARBAMOL 1000 MG/10ML IJ SOLN
500.0000 mg | Freq: Four times a day (QID) | INTRAVENOUS | Status: DC | PRN
  Filled 2016-05-08: qty 5

## 2016-05-08 MED ORDER — VANCOMYCIN HCL 10 G IV SOLR: 1500.0000 mg | INTRAVENOUS | Status: DC

## 2016-05-08 MED ORDER — PROPOFOL 10 MG/ML IV BOLUS
INTRAVENOUS | Status: AC
  Filled 2016-05-08: qty 20

## 2016-05-08 MED ORDER — LIDOCAINE 2% (20 MG/ML) 5 ML SYRINGE
INTRAMUSCULAR | Status: AC
  Filled 2016-05-08: qty 5

## 2016-05-08 MED ORDER — SODIUM CHLORIDE 0.9 % IR SOLN
Status: DC | PRN
  Administered 2016-05-08: 1000 mL
  Administered 2016-05-08: 3000 mL

## 2016-05-08 MED ORDER — MORPHINE SULFATE (PF) 2 MG/ML IV SOLN
0.5000 mg | INTRAVENOUS | Status: DC | PRN
  Administered 2016-05-08: 0.5 mg via INTRAVENOUS

## 2016-05-08 MED ORDER — LOSARTAN POTASSIUM 50 MG PO TABS
50.0000 mg | ORAL_TABLET | Freq: Every day | ORAL | Status: DC
  Administered 2016-05-09 – 2016-05-11 (×3): 50 mg via ORAL
  Filled 2016-05-08 (×4): qty 1

## 2016-05-08 MED ORDER — MENTHOL 3 MG MT LOZG: 1.0000 | LOZENGE | OROMUCOSAL | Status: DC | PRN

## 2016-05-08 MED ORDER — 0.9 % SODIUM CHLORIDE (POUR BTL) OPTIME
TOPICAL | Status: DC | PRN
  Administered 2016-05-08: 1000 mL

## 2016-05-08 MED ORDER — TRANEXAMIC ACID 1000 MG/10ML IV SOLN
1000.0000 mg | INTRAVENOUS | Status: AC
  Administered 2016-05-08: 1000 mg via INTRAVENOUS
  Filled 2016-05-08: qty 10

## 2016-05-08 MED ORDER — CEFAZOLIN SODIUM-DEXTROSE 2-4 GM/100ML-% IV SOLN
2.0000 g | Freq: Four times a day (QID) | INTRAVENOUS | Status: AC
  Administered 2016-05-08 – 2016-05-09 (×3): 2 g via INTRAVENOUS
  Filled 2016-05-08 (×3): qty 100

## 2016-05-08 MED ORDER — FENTANYL CITRATE (PF) 100 MCG/2ML IJ SOLN: 25.0000 ug | INTRAMUSCULAR | Status: DC | PRN

## 2016-05-08 MED ORDER — VANCOMYCIN HCL IN DEXTROSE 1-5 GM/200ML-% IV SOLN: 1000.0000 mg | INTRAVENOUS | Status: DC

## 2016-05-08 MED ORDER — ACETAMINOPHEN 650 MG RE SUPP: 650.0000 mg | Freq: Four times a day (QID) | RECTAL | Status: DC | PRN

## 2016-05-08 MED ORDER — OXYCODONE HCL 5 MG PO TABS
5.0000 mg | ORAL_TABLET | ORAL | Status: DC | PRN
  Administered 2016-05-08 – 2016-05-09 (×6): 10 mg via ORAL
  Administered 2016-05-11: 5 mg via ORAL
  Filled 2016-05-08 (×8): qty 2

## 2016-05-08 MED ORDER — ENSURE ENLIVE PO LIQD
237.0000 mL | Freq: Two times a day (BID) | ORAL | Status: DC
  Administered 2016-05-09 – 2016-05-11 (×5): 237 mL via ORAL
  Filled 2016-05-08: qty 237

## 2016-05-08 MED ORDER — PHENOL 1.4 % MT LIQD: 1.0000 | OROMUCOSAL | Status: DC | PRN

## 2016-05-08 SURGICAL SUPPLY — 50 items
BAG DECANTER FOR FLEXI CONT (MISCELLANEOUS) ×3 IMPLANT
CAPT HIP TOTAL 2 ×3 IMPLANT
CELLS DAT CNTRL 66122 CELL SVR (MISCELLANEOUS) IMPLANT
COVER SURGICAL LIGHT HANDLE (MISCELLANEOUS) ×3 IMPLANT
DRAPE C-ARM 42X72 X-RAY (DRAPES) ×3 IMPLANT
DRAPE STERI IOBAN 125X83 (DRAPES) ×3 IMPLANT
DRAPE U-SHAPE 47X51 STRL (DRAPES) ×9 IMPLANT
DRSG AQUACEL AG ADV 3.5X10 (GAUZE/BANDAGES/DRESSINGS) ×3 IMPLANT
DRSG MEPILEX BORDER 4X8 (GAUZE/BANDAGES/DRESSINGS) ×3 IMPLANT
DURAPREP 26ML APPLICATOR (WOUND CARE) ×3 IMPLANT
ELECT BLADE 4.0 EZ CLEAN MEGAD (MISCELLANEOUS) ×3
ELECT REM PT RETURN 9FT ADLT (ELECTROSURGICAL) ×3
ELECTRODE BLDE 4.0 EZ CLN MEGD (MISCELLANEOUS) ×1 IMPLANT
ELECTRODE REM PT RTRN 9FT ADLT (ELECTROSURGICAL) ×1 IMPLANT
GLOVE SKINSENSE NS SZ7.5 (GLOVE) ×2
GLOVE SKINSENSE STRL SZ7.5 (GLOVE) ×1 IMPLANT
GLOVE SURG SYN 7.5  E (GLOVE) ×4
GLOVE SURG SYN 7.5 E (GLOVE) ×2 IMPLANT
GOWN SRG XL XLNG 56XLVL 4 (GOWN DISPOSABLE) ×1 IMPLANT
GOWN STRL NON-REIN XL XLG LVL4 (GOWN DISPOSABLE) ×2
GOWN STRL REUS W/ TWL LRG LVL3 (GOWN DISPOSABLE) IMPLANT
GOWN STRL REUS W/TWL LRG LVL3 (GOWN DISPOSABLE)
HANDPIECE INTERPULSE COAX TIP (DISPOSABLE) ×2
HOOD PEEL AWAY FLYTE STAYCOOL (MISCELLANEOUS) ×6 IMPLANT
IV NS 1000ML (IV SOLUTION) ×2
IV NS 1000ML BAXH (IV SOLUTION) ×1 IMPLANT
IV NS IRRIG 3000ML ARTHROMATIC (IV SOLUTION) ×3 IMPLANT
KIT BASIN OR (CUSTOM PROCEDURE TRAY) ×3 IMPLANT
MARKER SKIN DUAL TIP RULER LAB (MISCELLANEOUS) ×3 IMPLANT
NEEDLE SPNL 18GX3.5 QUINCKE PK (NEEDLE) ×3 IMPLANT
PACK TOTAL JOINT (CUSTOM PROCEDURE TRAY) ×3 IMPLANT
PACK UNIVERSAL I (CUSTOM PROCEDURE TRAY) ×3 IMPLANT
RTRCTR WOUND ALEXIS 18CM MED (MISCELLANEOUS)
RTRCTR WOUND ALEXIS 18CM SML (INSTRUMENTS) ×3
SAVER CELL AAL HAEMONETICS (INSTRUMENTS) ×1 IMPLANT
SAW OSC TIP CART 19.5X105X1.3 (SAW) ×3 IMPLANT
SEALER BIPOLAR AQUA 6.0 (INSTRUMENTS) ×3 IMPLANT
SET HNDPC FAN SPRY TIP SCT (DISPOSABLE) ×1 IMPLANT
STAPLER VISISTAT 35W (STAPLE) IMPLANT
SUT ETHIBOND 2 V 37 (SUTURE) ×3 IMPLANT
SUT ETHIBOND NAB CT1 #1 30IN (SUTURE) ×9 IMPLANT
SUT VIC AB 1 CT1 27 (SUTURE) ×2
SUT VIC AB 1 CT1 27XBRD ANBCTR (SUTURE) ×1 IMPLANT
SUT VIC AB 2-0 CT1 27 (SUTURE) ×2
SUT VIC AB 2-0 CT1 TAPERPNT 27 (SUTURE) ×1 IMPLANT
SYR 20CC LL (SYRINGE) ×3 IMPLANT
SYR 50ML LL SCALE MARK (SYRINGE) ×3 IMPLANT
TOWEL OR 17X26 10 PK STRL BLUE (TOWEL DISPOSABLE) ×3 IMPLANT
TRAY CATH 16FR W/PLASTIC CATH (SET/KITS/TRAYS/PACK) ×3 IMPLANT
YANKAUER SUCT BULB TIP NO VENT (SUCTIONS) ×3 IMPLANT

## 2016-05-08 NOTE — Anesthesia Procedure Notes (Signed)
Procedure Name: MAC Performed by: Shondrea Steinert LEFFEW Pre-anesthesia Checklist: Patient identified, Emergency Drugs available, Suction available, Timeout performed and Patient being monitored Patient Re-evaluated:Patient Re-evaluated prior to inductionOxygen Delivery Method: Simple face mask Placement Confirmation: positive ETCO2       

## 2016-05-08 NOTE — Op Note (Signed)
TOTAL HIP ARTHROPLASTY ANTERIOR APPROACH  Procedure Note Julian Mejia   [id number]  Pre-op Diagnosis: Fractured right hip     Post-op Diagnosis: same   Operative Procedures  1. Total hip replacement; Right hip; uncemented [id number]   Personnel  Surgeon(s): Tarry Kos, MD   Anesthesia: spinal  Prosthesis: Depuy Acetabulum: Pinnacle 54 mm Femur: Corail KA 16 Head: 36 mm size: +5 Liner: +4 Bearing Type: Ceramic on poly  Date of Service: 05/07/2016 - 05/08/2016  Total Hip Arthroplasty (Anterior Approach) Op Note:  After informed consent was obtained and the operative extremity marked in the holding area, the patient was brought back to the operating room and placed supine on the HANA table. Next, the operative extremity was prepped and draped in normal sterile fashion. Surgical timeout occurred verifying patient identification, surgical site, surgical procedure and administration of antibiotics.  A modified anterior Smith-Peterson approach to the hip was performed, using the interval between tensor fascia lata and sartorius.  Dissection was carried bluntly down onto the anterior hip capsule. The lateral femoral circumflex vessels were identified and coagulated. A capsulotomy was performed and the capsular flaps tagged for later repair.  Fluoroscopy was utilized to prepare for the femoral neck cut. The neck osteotomy was performed. The femoral head was removed, the acetabular rim was cleared of soft tissue and attention was turned to reaming the acetabulum.  Sequential reaming was performed under fluoroscopic guidance. We reamed to a size 53 mm, and then impacted the acetabular shell. The liner was then placed after irrigation and attention turned to the femur.  After placing the femoral hook, the leg was taken to externally rotated, extended and adducted position taking care to perform soft tissue releases to allow for adequate mobilization of the femur. Soft tissue was cleared from the  shoulder of the greater trochanter and the hook elevator used to improve exposure of the proximal femur. Sequential broaching performed up to a size 16. Trial neck and head were placed. The leg was brought back up to neutral and the construct reduced. The position and sizing of components, offset and leg lengths were checked using fluoroscopy. Stability of the construct was checked in extension and external rotation without any subluxation or impingement of prosthesis. We dislocated the prosthesis, dropped the leg back into position, removed trial components, and irrigated copiously. The final stem and head was then placed, the leg brought back up, the system reduced and fluoroscopy used to verify positioning.  We irrigated, obtained hemostasis and closed the capsule using #2 ethibond suture.  Dilute betadyne solution was used. The fascia was closed with #1 vicryl plus, the deep fat layer was closed with 0 vicryl, the subcutaneous layers closed with 2.0 Vicryl Plus and the skin closed with staples. A sterile dressing was applied. The patient was awakened in the operating room and taken to recovery in stable condition.  All sponge, needle, and instrument counts were correct at the end of the case.   Position: supine  Complications: none.  Time Out: performed   Drains/Packing: none  Estimated blood loss: 200 cc  Returned to Recovery Room: in good condition.   Antibiotics: yes   Mechanical VTE (DVT) Prophylaxis: sequential compression devices, TED thigh-high  Chemical VTE (DVT) Prophylaxis: lovenox   Fluid Replacement: see anesthesia record  Specimens Removed: 1 to pathology   Sponge and Instrument Count Correct? yes   PACU: portable radiograph - low AP   Admission: inpatient status, start PT & OT POD#1  Plan/RTC: Return  in 2 weeks for staple removal. Return in 6 weeks to see MD.  Weight Bearing/Load Lower Extremity: full  Hip precautions: none Suture Removal: 10-14 days  Betadine to  incision twice daily once dressing is removed on POD#7  N. Glee Arvin, MD [facility] [phone] 12:17 PM      Implant Name Type Inv. Item Serial No. Manufacturer Lot No. LRB No. Used  ACETABULAR CUP W GRIPTION - [id number] Plate ACETABULAR CUP W GRIPTION  DEPUY [id number] Right 1  LINER NEUTRAL 54X36MM PLUS 4 - [id number] Hips LINER NEUTRAL 54X36MM PLUS 4  DEPUY [id number] Right 1  SCREW 6.5MMX25MM - [id number] Screw SCREW 6.5MMX25MM  DEPUY [id number] Right 1  SCREW 6.5MMX25MM - [id number] Screw SCREW 6.5MMX25MM  DEPUY [id number] Right 1  HEAD CERAMIC 36 PLUS5 - [id number] Hips HEAD CERAMIC 36 PLUS5  DEPUY [id number] Right 1  STEM CORAIL KA16 - [id number] Stem STEM CORAIL KA16   DEPUY [id number] Right 1

## 2016-05-08 NOTE — Progress Notes (Addendum)
Patient ID: Julian Mejia, male   DOB: [birth year], 65 y.o.   MRN: [id number]  PROGRESS NOTE    Julian Mejia  XVQ:[id number] DOB: Apr 26, 1951 DOA: 05/07/2016  PCP: Sharlene Motts, MD   Brief Narrative:   65 y.o. male with past medical history of hypertension, memory loss who presented to [facility] status post fall. Patient sustained right hip fracture. Patient seen by orthopedic surgery consultation and plan is for surgery today.  Assessment & Plan:   Principal Problem:   Closed subcapital fracture of neck of right femur (HCC) / Mechanical fall - Status post mechanical fall. - Appreciate orthopedic surgery recommendations - Plan for surgery today total hip replacement - Continue pain management efforts - Nothing by mouth prior to surgery - Preoperative vancomycin and cefazolin per orthopedic surgery  Active Problems:   Essential hypertension - Unclear which medications patient is taking. His current blood pressure is 117/87 - We'll continue to monitor blood pressure    Acute respiratory failure with hypoxia / COPD - Hypoxia on the admission, oxygen saturation 89% on room air but has improved with nasal cannula oxygen support to 98% - Respiratory status stable    Memory loss - Stable     DVT prophylaxis: SCD's bilaterally  Code Status: full code  Family Communication: no family at the bedside this am  Disposition Plan: home or SNF once stable from ortho point    Consultants:   Orthopedic surgery  Procedures:   Total hip replacement 05/08/2016  Antimicrobials:   Vancomycin and cefazolin preoperatively    Subjective: No overnight events.   Objective: Vitals:   05/07/16 2245 05/07/16 2347 05/08/16 0000 05/08/16 0535  BP: 110/89 (!) 147/81  117/87  Pulse: 90 95  (!) 103  Resp: 21 (!) 21  (!) 21  Temp:  98.8 F (37.1 C)  99.9 F (37.7 C)  TempSrc:  Oral  Oral  SpO2: 90% (!) 89% 95% 96%    Intake/Output Summary (Last 24 hours) at 05/08/16 1035 Last  data filed at 05/08/16 0815  Gross per 24 hour  Intake                0 ml  Output              200 ml  Net             -200 ml   There were no vitals filed for this visit.  Examination:  General exam: Appears calm and comfortable  Respiratory system: Clear to auscultation. Respiratory effort normal. Cardiovascular system: S1 & S2 heard, RRR. No JVD,  Gastrointestinal system: Abdomen is nondistended, soft and nontender. No organomegaly or masses felt. Normal bowel sounds heard. Central nervous system: Alert and oriented. No focal neurological deficits. Extremities: Symmetric, tenderness right LE, palpable pulses  Skin: No rashes, lesions or ulcers Psychiatry: Judgement and insight appear normal. Mood & affect appropriate.   Data Reviewed: I have personally reviewed following labs and imaging studies  CBC:  Recent Labs Lab 05/07/16 2250 05/08/16 0026  WBC 9.8 8.7  NEUTROABS 7.7  --   HGB 13.1 13.2  HCT 38.2* 38.8*  MCV 92.9 92.8  PLT 226 234   Basic Metabolic Panel:  Recent Labs Lab 05/07/16 2250 05/08/16 0026  NA 130* 134*  K 3.9 3.9  CL 98* 101  CO2 21* 23  GLUCOSE 108* 111*  BUN 11 11  CREATININE 0.77 0.78  CALCIUM 9.1 9.3   GFR: CrCl cannot be calculated (Unknown ideal weight.). Liver  Function Tests: No results for input(s): AST, ALT, ALKPHOS, BILITOT, PROT, ALBUMIN in the last 168 hours. No results for input(s): LIPASE, AMYLASE in the last 168 hours. No results for input(s): AMMONIA in the last 168 hours. Coagulation Profile: No results for input(s): INR, PROTIME in the last 168 hours. Cardiac Enzymes: No results for input(s): CKTOTAL, CKMB, CKMBINDEX, TROPONINI in the last 168 hours. BNP (last 3 results) No results for input(s): PROBNP in the last 8760 hours. HbA1C: No results for input(s): HGBA1C in the last 72 hours. CBG: No results for input(s): GLUCAP in the last 168 hours. Lipid Profile: No results for input(s): CHOL, HDL, LDLCALC, TRIG,  CHOLHDL, LDLDIRECT in the last 72 hours. Thyroid Function Tests: No results for input(s): TSH, T4TOTAL, FREET4, T3FREE, THYROIDAB in the last 72 hours. Anemia Panel: No results for input(s): VITAMINB12, FOLATE, FERRITIN, TIBC, IRON, RETICCTPCT in the last 72 hours. Urine analysis: No results found for: COLORURINE, APPEARANCEUR, LABSPEC, PHURINE, GLUCOSEU, HGBUR, BILIRUBINUR, KETONESUR, PROTEINUR, UROBILINOGEN, NITRITE, LEUKOCYTESUR Sepsis Labs: (procalcitonin:4,lacticidven:4)   Recent Results (from the past 240 hour(s))  Surgical PCR screen     Status: None   Collection Time: 05/08/16  7:04 AM  Result Value Ref Range Status   MRSA, PCR NEGATIVE NEGATIVE Final   Staphylococcus aureus NEGATIVE NEGATIVE Final      Radiology Studies: Chest Portable 1 View Result Date: 05/07/2016 CLINICAL DATA:   1.  No acute cardiopulmonary abnormality.  Pulmonary hyperinflation. 2. Significantly progressed tortuosity of the thoracic aorta since 2010. Consider non-emergent imaging followup by CTA or MRA. Electronically Signed   By: Odessa Fleming M.D.   On: 05/07/2016 23:10   Dg Hip Unilat  With Pelvis 2-3 Views Right Result Date: 05/07/2016 CLINICAL DATA: Acute subcapital fracture of the right femoral neck with slight impaction. Electronically Signed   By: Rise Mu M.D.   On: 05/07/2016 19:18      Scheduled Meds: . bupivacaine liposome  20 mL Infiltration Once  .  ceFAZolin (ANCEF) IV  3 g Intravenous On Call to OR  .  ceFAZolin (ANCEF) IV  2 g Intravenous On Call to OR  . [MAR Hold] docusate sodium  100 mg Oral BID  . [MAR Hold] folic acid  1 mg Oral Daily  . [MAR Hold] multivitamin with minerals  1 tablet Oral Daily  . povidone-iodine  2 application Topical Once  . [MAR Hold] thiamine  100 mg Oral Daily   Or  . [MAR Hold] thiamine  100 mg Intravenous Daily  . [MAR Hold] tiotropium  18 mcg Inhalation Daily  . tranexamic acid  (ORTHO-IV)  1,000 mg Intravenous To OR  . tranexamic  acid (CYKLOKAPRON) topical -INTRAOP  2,000 mg Topical Once  . vancomycin  1,500 mg Intravenous On Call to OR  . vancomycin  1,000 mg Intravenous On Call to OR   Continuous Infusions: . lactated ringers       LOS: 1 day    Time spent: 15 minutes  Greater than 50% of the time spent on counseling and coordinating the care.   Manson Passey, MD [facility] Pager [phone]  If 7PM-7AM, please contact night-coverage [email or web address] Password [email or web address] 05/08/2016, 10:35 AM

## 2016-05-08 NOTE — Plan of Care (Signed)
Problem: Pain Managment: Goal: General experience of comfort will improve Outcome: Progressing Denies pain  Problem: Physical Regulation: Goal: Ability to maintain clinical measurements within normal limits will improve Outcome: Progressing Able to move in the bed  Problem: Skin Integrity: Goal: Risk for impaired skin integrity will decrease Outcome: Progressing Small skin tear to right upper arm cleaned wit saline and covered with pink foam. No signs of infection noted. Right middle and third fingers with small abraision and old dry blood cleaned with saline and covered with dry gauzes and Tegaderm  Problem: Tissue Perfusion: Goal: Risk factors for ineffective tissue perfusion will decrease Outcome: Progressing New admit  Problem: Fluid Volume: Goal: Ability to maintain a balanced intake and output will improve Outcome: Progressing Patient is NPO

## 2016-05-08 NOTE — Transfer of Care (Signed)
Immediate Anesthesia Transfer of Care Note  Patient: Julian Mejia  Procedure(s) Performed: Procedure(s): TOTAL HIP ARTHROPLASTY ANTERIOR APPROACH (Right)  Patient Location: PACU  Anesthesia Type:MAC and Spinal  Level of Consciousness: awake, patient cooperative and responds to stimulation  Airway & Oxygen Therapy: Patient Spontanous Breathing and Patient connected to face mask oxygen  Post-op Assessment: Report given to RN and Post -op Vital signs reviewed and stable  Post vital signs: Reviewed and stable  Last Vitals:  Vitals:   05/07/16 2347 05/08/16 0535  BP: (!) 147/81 117/87  Pulse: 95 (!) 103  Resp: (!) 21 (!) 21  Temp: 37.1 C 37.7 C    Last Pain:  Vitals:   05/08/16 0535  TempSrc: Oral  PainSc:          Complications: No apparent anesthesia complications

## 2016-05-08 NOTE — Anesthesia Procedure Notes (Signed)
Spinal  Patient location during procedure: OR Start time: 05/08/2016 10:30 AM End time: 05/08/2016 10:36 AM Staffing Anesthesiologist: Linton Rump Performed: anesthesiologist  Preanesthetic Checklist Completed: patient identified, surgical consent, pre-op evaluation, timeout performed, IV checked, risks and benefits discussed and monitors and equipment checked Spinal Block Patient position: left lateral decubitus Prep: DuraPrep Patient monitoring: heart rate, continuous pulse ox and blood pressure Approach: midline Location: L2-3 Injection technique: single-shot Needle Needle type: Whitacre  Needle gauge: 25 G

## 2016-05-08 NOTE — Anesthesia Preprocedure Evaluation (Addendum)
Anesthesia Evaluation  Patient identified by MRN, date of birth, ID band  Reviewed: Allergy & Precautions, NPO status , Patient's Chart, lab work & pertinent test results  History of Anesthesia Complications Negative for: history of anesthetic complications  Airway Mallampati: II  TM Distance: >3 FB Neck ROM: Full    Dental  (+) Dental Advisory Given, Partial Lower, Partial Upper   Pulmonary COPD (used inhaler this morning),  COPD inhaler, Current Smoker,    Pulmonary exam normal breath sounds clear to auscultation       Cardiovascular Exercise Tolerance: Poor hypertension, Pt. on medications (-) angina+ DOE  (-) Past MI, (-) Cardiac Stents and (-) CABG Normal cardiovascular exam Rhythm:Regular Rate:Normal  CXR 05/07/2016: IMPRESSION: 1.  No acute cardiopulmonary abnormality.  Pulmonary hyperinflation. 2. Significantly progressed tortuosity of the thoracic aorta since 2010. Consider non-emergent imaging followup by CTA or MRA.   Neuro/Psych Memory loss    GI/Hepatic negative GI ROS, Neg liver ROS,   Endo/Other  negative endocrine ROS  Renal/GU negative Renal ROS     Musculoskeletal   Abdominal   Peds  Hematology negative hematology ROS (+)   Anesthesia Other Findings 65 y.o. male who presents with right hip fracture s/p mechanical fall. Chronic cough.  Reproductive/Obstetrics                           Anesthesia Physical Anesthesia Plan  ASA: III  Anesthesia Plan: Spinal   Post-op Pain Management:    Induction:   Airway Management Planned: Simple Face Mask  Additional Equipment:   Intra-op Plan:   Post-operative Plan:   Informed Consent: I have reviewed the patients History and Physical, chart, labs and discussed the procedure including the risks, benefits and alternatives for the proposed anesthesia with the patient or authorized representative who has indicated his/her  understanding and acceptance.   Dental advisory given  Plan Discussed with:   Anesthesia Plan Comments: (Discussed spinal for anesthetic with backup GA. Patient in agreement with plan.)        Anesthesia Quick Evaluation

## 2016-05-08 NOTE — Anesthesia Postprocedure Evaluation (Signed)
Anesthesia Post Note  Patient: Robbin Styles  Procedure(s) Performed: Procedure(s) (LRB): TOTAL HIP ARTHROPLASTY ANTERIOR APPROACH (Right)  Patient location during evaluation: PACU Anesthesia Type: Spinal Level of consciousness: oriented and awake and alert Pain management: pain level controlled Vital Signs Assessment: post-procedure vital signs reviewed and stable Respiratory status: spontaneous breathing and respiratory function stable Cardiovascular status: blood pressure returned to baseline and stable Postop Assessment: no headache and no backache Anesthetic complications: no    Last Vitals:  Vitals:   05/08/16 1400 05/08/16 1454  BP:  (!) 133/94  Pulse:  (!) 106  Resp:    Temp: 36.9 C 37.1 C    Last Pain:  Vitals:   05/08/16 1508  TempSrc:   PainSc: 10-Worst pain ever                 Linton Rump

## 2016-05-08 NOTE — Consult Note (Signed)
ORTHOPAEDIC CONSULTATION  REQUESTING PHYSICIAN: Robbie Lis, MD  Chief Complaint: Right femoral neck hip fracture  HPI: Julian Mejia is a 65 y.o. male who presents with right hip fracture s/p mechanical fall PTA.  The patient endorses severe pain in the right hip, that does not radiate, grinding in quality, worse with any movement, better with immobilization.  Denies LOC/fever/chills/nausea/vomiting.  Walks without assistive devices (walker, cane, wheelchair).  Does live independently.  Patient is Hydrographic surveyor.  Denies LOC, neck pain, abd pain.  Past Medical History:  Diagnosis Date  . COPD (chronic obstructive pulmonary disease) ([place])   . Hypertension    Past Surgical History:  Procedure Laterality Date  . HERNIA REPAIR    . REPAIR OF PERFORATED ULCER     Social History   Social History  . Marital status: Married    Spouse name: N/A  . Number of children: N/A  . Years of education: N/A   Social History Main Topics  . Smoking status: Current Every Day Smoker    Packs/day: 1.00    Types: Cigarettes  . Smokeless tobacco: Never Used  . Alcohol use Yes     Comment: daily  . Drug use: No  . Sexual activity: Not Asked   Other Topics Concern  . None   Social History Narrative  . None   Family History  Problem Relation Age of Onset  . Cancer Mother   . Coronary artery disease Father    No Known Allergies Prior to Admission medications   Medication Sig Start Date End Date Taking? Authorizing Provider  albuterol (PROVENTIL HFA;VENTOLIN HFA) 108 (90 Base) MCG/ACT inhaler Inhale into the lungs every 6 (six) hours as needed for wheezing or shortness of breath.   Yes Historical Provider, MD  alendronate (FOSAMAX) 70 MG tablet Take 70 mg by mouth once a week. Take with a full glass of water on an empty stomach. 05/08/16  Yes Historical Provider, MD  amLODipine (NORVASC) 5 MG tablet Take 10 mg by mouth daily.   Yes Historical Provider, MD  cholecalciferol (VITAMIN D)  1000 units tablet Take 1,000 Units by mouth daily.   Yes Historical Provider, MD  donepezil (ARICEPT) 5 MG tablet Take 10 mg by mouth daily.   Yes Historical Provider, MD  losartan (COZAAR) 50 MG tablet Take 50 mg by mouth daily.   Yes Historical Provider, MD  metoprolol (LOPRESSOR) 50 MG tablet Take 50 mg by mouth daily.   Yes Historical Provider, MD  tiotropium (SPIRIVA) 18 MCG inhalation capsule Place 18 mcg into inhaler and inhale daily.   Yes Historical Provider, MD   Chest Portable 1 View  Result Date: 05/07/2016 CLINICAL DATA:  66 year old male preoperative study for hip fracture. Initial encounter. COPD EXAM: PORTABLE CHEST 1 VIEW COMPARISON:  07/27/2009. FINDINGS: Portable AP semi upright view at 2256 hours. Increased and moderate to severe tortuosity of the thoracic aorta. Other mediastinal contours are within normal limits. Visualized tracheal air column is within normal limits. Allowing for portable technique, the lungs are clear. Chronic hyperinflation. No pneumothorax or pleural effusion. IMPRESSION: 1.  No acute cardiopulmonary abnormality.  Pulmonary hyperinflation. 2. Significantly progressed tortuosity of the thoracic aorta since 2010. Consider non-emergent imaging followup by CTA or MRA. Electronically Signed   By: Genevie Ann M.D.   On: 05/07/2016 23:10   Dg Hip Unilat  With Pelvis 2-3 Views Right  Result Date: 05/07/2016 CLINICAL DATA:  Initial evaluation for acute trauma, fall. Acute right-sided hip pain. EXAM: DG  HIP (WITH OR WITHOUT PELVIS) 2-3V RIGHT COMPARISON:  None. FINDINGS: There is an acute subcapital fracture through the right femoral neck with slight impaction. Femoral head remains normally position within the acetabulum. Visualized right femur are otherwise intact. Bony pelvis intact without associated fracture. The SI joints approximated. Limited views of the left hip grossly unremarkable. 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 Scattered vascular  calcifications noted within the proximal thighs. IMPRESSION: Acute subcapital fracture of the right femoral neck with slight impaction. Electronically Signed   By: Jeannine Boga M.D.   On: 05/07/2016 19:18    All pertinent xrays, MRI, CT independently reviewed and interpreted  Positive ROS: All other systems have been reviewed and were otherwise negative with the exception of those mentioned in the HPI and as above.  Physical Exam: General: Alert, no acute distress Cardiovascular: No pedal edema Respiratory: No cyanosis, no use of accessory musculature GI: No organomegaly, abdomen is soft and non-tender Skin: No lesions in the area of chief complaint Neurologic: Sensation intact distally Psychiatric: Patient is competent for consent with normal mood and affect Lymphatic: No axillary or cervical lymphadenopathy  MUSCULOSKELETAL:  - pain with movement of the hip and extremity - skin intact - NVI distally - compartments soft  Assessment: Right femoral neck hip fracture  Plan: - total hip replacement is recommended, patient and family are aware of r/b/a and wish to proceed - consent obtained - medical optimization per primary team - surgery is planned for today - Based on history and fracture pattern this likely represents a fragility fracture. - Fragility fractures affect up to one half of women and one third of men after age 72 years and occur in the setting of bone disorder such as osteoporosis or osteopenia and warrant appropriate work-up. - The following are general recommendations that may serve as an outline for an appropriate work-up:  1.) Obtain bone density measurement to confirm presumptive diagnosis, assess severity of osteoporosis and risk of future fracture, and use as baseline for monitoring treatment  2.) Obtain laboratory tests: CBC, ESR, serum calcium, creatinine, albumin,phosphate, alkaline phosphatase, liver transaminases, protein electrophoresis,  urinalysis, 25-hydroxyvitamin D.  3.) Exclude secondary causes of low bone mass and skeletal fragility (eg,multiple myeloma, lymphoma) as indicated.  4.) Obtain radiograph of thoracic and lumbar spine, particularly among individuals with back pain or height loss to assess presence of vertebral fractures  5.) Intermittent administration of recombinant human parathyroid hormone  6.) Optimize nutritional status using nutritional supplementation.  7.) Patient/family education to prevent future falls.  8.) Early mobilization and exercise program - exercise decreases the rate of bone loss and has been associated with decreased rate of fragility fractures   Thank you for the consult and the opportunity to see Mr. Julian Mejia. Eduard Roux, MD [place] 7:49 AM

## 2016-05-08 NOTE — Progress Notes (Signed)
Nutrition Follow-up  DOCUMENTATION CODES:   Not applicable  INTERVENTION:  Once diet advances, provide Ensure Enlive po BID, each supplement provides 350 kcal and 20 grams of protein.  Recommend obtaining height and weight measurement.   NUTRITION DIAGNOSIS:   Increased nutrient needs related to  (surgery) as evidenced by estimated needs  GOAL:   Patient will meet greater than or equal to 90% of their needs  MONITOR:   Supplement acceptance, Diet advancement, Labs, Weight trends, Skin, I & O's  REASON FOR ASSESSMENT:   Consult Hip fracture protocol  ASSESSMENT:   65 y.o. male who presents with right hip fracture s/p mechanical fall PTA. PMH of COPD and HTN.  Pt in OR during attempted time of visit. RD unable to obtain nutrition history. Noted no height or weight measurement recorded. Recommend obtaining new height and weight to fully assess needs. RD to order Ensure to aid in caloric and protein needs. Nursing staff to provide once diet advances. Unable to complete Nutrition-Focused physical exam at this time.   Labs and medications reviewed.   Diet Order:  Diet NPO time specified  Skin:   (Incision on R hip)  Last BM:  8/3  Height:   Ht Readings from Last 1 Encounters:  No data found for Ht    Weight:   Wt Readings from Last 1 Encounters:  No data found for Wt    Ideal Body Weight:    N/A  BMI:  There is no height or weight on file to calculate BMI.  Estimated Nutritional Needs:   Kcal:  1800-2000  Protein:  85-100 grams  Fluid:  Per MD  EDUCATION NEEDS:   No education needs identified at this time  Roslyn Smiling, MS, RD, LDN Pager # [phone] After hours/ weekend pager # [phone]

## 2016-05-09 DIAGNOSIS — D62 Acute posthemorrhagic anemia: Secondary | ICD-10-CM

## 2016-05-09 LAB — BASIC METABOLIC PANEL
ANION GAP: 9 (ref 5–15)
BUN: 7 mg/dL (ref 6–20)
CHLORIDE: 98 mmol/L — AB (ref 101–111)
CO2: 27 mmol/L (ref 22–32)
Calcium: 8.6 mg/dL — ABNORMAL LOW (ref 8.9–10.3)
Creatinine, Ser: 0.52 mg/dL — ABNORMAL LOW (ref 0.61–1.24)
Glucose, Bld: 105 mg/dL — ABNORMAL HIGH (ref 65–99)
POTASSIUM: 3.6 mmol/L (ref 3.5–5.1)
SODIUM: 134 mmol/L — AB (ref 135–145)

## 2016-05-09 LAB — CBC
HCT: 31.9 % — ABNORMAL LOW (ref 39.0–52.0)
HEMOGLOBIN: 10.5 g/dL — AB (ref 13.0–17.0)
MCH: 31.2 pg (ref 26.0–34.0)
MCHC: 32.9 g/dL (ref 30.0–36.0)
MCV: 94.7 fL (ref 78.0–100.0)
PLATELETS: 185 10*3/uL (ref 150–400)
RBC: 3.37 MIL/uL — AB (ref 4.22–5.81)
RDW: 14.2 % (ref 11.5–15.5)
WBC: 7.5 10*3/uL (ref 4.0–10.5)

## 2016-05-09 LAB — VITAMIN D 25 HYDROXY (VIT D DEFICIENCY, FRACTURES): Vit D, 25-Hydroxy: 46 ng/mL (ref 30.0–100.0)

## 2016-05-09 NOTE — Progress Notes (Signed)
Patient ID: Julian Mejia, male   DOB: 12/01/1950, 65 y.o.   MRN: [id number]  PROGRESS NOTE    Julian Mejia  MOQ:[id number] DOB: 1951/04/02 DOA: 05/07/2016  PCP: Sharlene Motts, MD   Brief Narrative:   65 y.o. male with past medical history of hypertension, memory loss who presented to [facility] status post fall. Patient sustained right hip fracture. Patient seen by orthopedic surgery consultation and plan is for surgery today.  Assessment & Plan:   Principal Problem:   Closed subcapital fracture of neck of right femur (HCC) / Mechanical fall - Status post mechanical fall. 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 - PT eval in progress - D/C to SNF once cleared by ortho - Continue pain management efforts  Active Problems:   Acute blood loss anemia - Postoperative - Hgb down from 13.2 to 10.5 - Recheck CBC in am    Essential hypertension - BP 122/80 - Continue Norvasc, metoprolol, losartan     Acute respiratory failure with hypoxia / COPD - Hypoxia on the admission, oxygen saturation 89% on room air but has improved with nasal cannula oxygen support to 98% - Respiratory status stable    Memory loss - Stable  - Continue aricept    DVT prophylaxis: Lovenox subQ  Code Status: full code  Family Communication: no family at the bedside this am  Disposition Plan: home or SNF once stable from ortho point    Consultants:   Orthopedic surgery  Procedures:   Total hip replacement 05/08/2016  Antimicrobials:   Vancomycin and cefazolin preoperatively    Subjective: No overnight events. Pain controlled.   Objective: Vitals:   05/09/16 0515 05/09/16 0935 05/09/16 0938 05/09/16 0950  BP: (!) 151/88 122/80    Pulse: (!) 106 80    Resp: 18 15    Temp: 98.5 F (36.9 C)     TempSrc: Oral     SpO2: 90% (!) 82% 93% 91%    Intake/Output Summary (Last 24 hours) at 05/09/16 1003 Last data filed at 05/09/16 0600  Gross per 24 hour  Intake             3165 ml  Output               500 ml  Net             2665 ml   There were no vitals filed for this visit.  Examination:  General exam: No distress  Respiratory system: NO wheezing  Cardiovascular system: S1 & S2 heard, RRR.  Gastrointestinal system: Abdomen is nondistended, () BS Central nervous system: Alert and oriented. No focal neurological deficits. Extremities: Symmetric, palpable pulses  Skin: Skin warm and dry  Psychiatry: Normal mood and behavior    Data Reviewed: I have personally reviewed following labs and imaging studies  CBC:  Recent Labs Lab 05/07/16 2250 05/08/16 0026 05/09/16 0427  WBC 9.8 8.7 7.5  NEUTROABS 7.7  --   --   HGB 13.1 13.2 10.5*  HCT 38.2* 38.8* 31.9*  MCV 92.9 92.8 94.7  PLT 226 234 185   Basic Metabolic Panel:  Recent Labs Lab 05/07/16 2250 05/08/16 0026 05/09/16 0427  NA 130* 134* 134*  K 3.9 3.9 3.6  CL 98* 101 98*  CO2 21* 23 27  GLUCOSE 108* 111* 105*  BUN 11 11 7   CREATININE 0.77 0.78 0.52*  CALCIUM 9.1 9.3 8.6*   GFR: CrCl cannot be calculated (Unknown ideal weight.). Liver Function Tests: No results for input(s):  AST, ALT, ALKPHOS, BILITOT, PROT, ALBUMIN in the last 168 hours. No results for input(s): LIPASE, AMYLASE in the last 168 hours. No results for input(s): AMMONIA in the last 168 hours. Coagulation Profile: No results for input(s): INR, PROTIME in the last 168 hours. Cardiac Enzymes: No results for input(s): CKTOTAL, CKMB, CKMBINDEX, TROPONINI in the last 168 hours. BNP (last 3 results) No results for input(s): PROBNP in the last 8760 hours. HbA1C: No results for input(s): HGBA1C in the last 72 hours. CBG: No results for input(s): GLUCAP in the last 168 hours. Lipid Profile: No results for input(s): CHOL, HDL, LDLCALC, TRIG, CHOLHDL, LDLDIRECT in the last 72 hours. Thyroid Function Tests: No results for input(s): TSH, T4TOTAL, FREET4, T3FREE, THYROIDAB in the last 72 hours. Anemia Panel: No results for input(s):  VITAMINB12, FOLATE, FERRITIN, TIBC, IRON, RETICCTPCT in the last 72 hours. Urine analysis: No results found for: COLORURINE, APPEARANCEUR, LABSPEC, PHURINE, GLUCOSEU, HGBUR, BILIRUBINUR, KETONESUR, PROTEINUR, UROBILINOGEN, NITRITE, LEUKOCYTESUR Sepsis Labs: @LABRCNTIP (procalcitonin:4,lacticidven:4)   Recent Results (from the past 240 hour(s))  Surgical PCR screen     Status: None   Collection Time: 05/08/16  7:04 AM  Result Value Ref Range Status   MRSA, PCR NEGATIVE NEGATIVE Final   Staphylococcus aureus NEGATIVE NEGATIVE Final      Radiology Studies:  Chest Portable 1 View Result Date: 05/07/2016 CLINICAL DATA:   1.  No acute cardiopulmonary abnormality.  Pulmonary hyperinflation. 2. Significantly progressed tortuosity of the thoracic aorta since 2010. Consider non-emergent imaging followup by CTA or MRA.   Dg Hip Unilat  With Pelvis 2-3 Views Right Result Date: 05/07/2016 CLINICAL DATA: Acute subcapital fracture of the right femoral neck with slight impaction. Electronically Signed   By: Rise Mu M.D.   On: 05/07/2016 19:18   Pelvis Portable Result Date: 05/08/2016 CLINICAL DATA:  Right hip replacement.  No complicating feature Electronically Signed   By: Charlett Nose M.D.   On: 05/08/2016 13:55   Dg C-arm 61-120 Min Result Date: 05/08/2016 CLINICAL DATA: Anatomic alignment in the AP projection post right total hip arthroplasty. Electronically Signed   By: Hulan Saas M.D.   On: 05/08/2016 12:40   Dg Hip Operative Unilat With Pelvis Right Result Date: 05/08/2016 CLINICAL DATA:  Anatomic alignment in the AP projection post right total hip arthroplasty. Electronically Signed   By: Hulan Saas M.D.   On: 05/08/2016 12:40        Scheduled Meds: . amLODipine  10 mg Oral Daily  . bupivacaine liposome  20 mL Infiltration Once  . docusate sodium  100 mg Oral BID  . donepezil  10 mg Oral QHS  . enoxaparin (LOVENOX) injection  40 mg Subcutaneous Q24H  . feeding  supplement (ENSURE ENLIVE)  237 mL Oral BID BM  . folic acid  1 mg Oral Daily  . losartan  50 mg Oral Daily  . metoprolol  50 mg Oral Daily  . multivitamin with minerals  1 tablet Oral Daily  . thiamine  100 mg Oral Daily   Or  . thiamine  100 mg Intravenous Daily  . tiotropium  18 mcg Inhalation Daily  . tranexamic acid (CYKLOKAPRON) topical -INTRAOP  2,000 mg Topical Once   Continuous Infusions: . sodium chloride 125 mL/hr at 05/09/16 0800  . lactated ringers       LOS: 2 days    Time spent: 25 minutes  Greater than 50% of the time spent on counseling and coordinating the care.   Manson Passey, MD  [facility] Pager [phone]  If 7PM-7AM, please contact night-coverage [email or web address] Password [email or web address] 05/09/2016, 10:03 AM

## 2016-05-09 NOTE — Evaluation (Signed)
Occupational Therapy Evaluation Patient Details Name: Zyaire Radermacher MRN: [id number] DOB: [birth year] Today's Date: 05/09/2016    History of Present Illness Admitted s/p mechanical fall with hip fracture when walking the dog. Underwent R Anterior THA on 8/4. past medical history significant for COPD, HTN, early dementia, and smoking   Clinical Impression   PTA pt independent in ADL. Pt dependent in IADL. Pt will benefit from skilled OT to increase their independence and safety with DME, and self-care tasks. Due to cognitive issues (early dementia) and lack of 24 hour care at home pt would likely be best suited for short term rehab at [facility] where he can become more  independent with ADL again.    Follow Up Recommendations  SNF    Equipment Recommendations  Other (comment) (To be determined by next venue of care)    Recommendations for Other Services       Precautions / Restrictions Precautions Precautions: Fall Restrictions Weight Bearing Restrictions: Yes RLE Weight Bearing: Weight bearing as tolerated      Mobility Bed Mobility Overal bed mobility: Needs Assistance Bed Mobility: Supine to Sit     Supine to sit: Mod assist     General bed mobility comments: increased time and 3 rest breaks before being able to sit up  Transfers Overall transfer level: Needs assistance Equipment used: 1 person hand held assist (gait belt) Transfers: Sit to/from Stand;Stand Pivot Transfers Sit to Stand: Mod assist;From elevated surface Stand pivot transfers: Max assist       General transfer comment: sit to stand and pivot to chair with max assit    Balance Overall balance assessment: Needs assistance Sitting-balance support: Single extremity supported Sitting balance-Leahy Scale: Poor     Standing balance support: Bilateral upper extremity supported Standing balance-Leahy Scale: Poor                              ADL Overall ADL's : Needs assistance/impaired      Grooming: Minimal assistance;Sitting   Upper Body Bathing: Min guard   Lower Body Bathing: Moderate assistance;Sitting/lateral leans   Upper Body Dressing : Min guard   Lower Body Dressing: Moderate assistance;Sitting/lateral leans   Toilet Transfer: Maximal assistance Toilet Transfer Details (indicate cue type and reason): stand pivot, max assist for power up Toileting- Clothing Manipulation and Hygiene: Total assistance         General ADL Comments: Pt able to reach down to feet to doff socks sitting EOB. Pt able to perform grooming sitting EOB. Max assit for stand pivot to chair.     Vision     Perception     Praxis      Pertinent Vitals/Pain Pain Assessment: 0-10 Pain Score: 4  Pain Location: R hip, spikes when coughing Pain Intervention(s): Monitored during session;Premedicated before session;Repositioned     Hand Dominance Right   Extremity/Trunk Assessment Upper Extremity Assessment Upper Extremity Assessment: Overall WFL for tasks assessed   Lower Extremity Assessment Lower Extremity Assessment: RLE deficits/detail RLE Deficits / Details: decreased AROM and strength in hip and knee RLE: Unable to fully assess due to pain       Communication Communication Communication: No difficulties   Cognition Arousal/Alertness: Awake/alert Behavior During Therapy: WFL for tasks assessed/performed Overall Cognitive Status: History of cognitive impairments - at baseline                     General Comments  Exercises       Shoulder Instructions      Home Living Family/patient expects to be discharged to:: Skilled nursing facility Living Arrangements: Spouse/significant other                               Additional Comments: Pt lives in 1 level house with 1 step entry. was independent in ADL and mobility PTA.      Prior Functioning/Environment Level of Independence: Independent             OT Diagnosis: Generalized  weakness;Acute pain   OT Problem List: Decreased strength;Decreased range of motion;Decreased activity tolerance;Impaired balance (sitting and/or standing);Decreased knowledge of use of DME or AE;Pain   OT Treatment/Interventions:      OT Goals(Current goals can be found in the care plan section) ADL Goals Pt Will Perform Lower Body Bathing: with min guard assist;sitting/lateral leans;with adaptive equipment Pt Will Perform Lower Body Dressing: with min guard assist;sitting/lateral leans Pt Will Transfer to Toilet: with supervision;ambulating;bedside commode Pt Will Perform Toileting - Clothing Manipulation and hygiene: with supervision;sit to/from stand Pt Will Perform Tub/Shower Transfer: with min guard assist;ambulating;shower seat;rolling walker  OT Frequency:     Barriers to D/C:            Co-evaluation              End of Session Equipment Utilized During Treatment: Gait belt;Oxygen Nurse Communication: Mobility status  Activity Tolerance: Patient limited by fatigue Patient left: in chair;with call bell/phone within reach;with family/visitor present   Time: 1520-1555 OT Time Calculation (min): 35 min Charges:  OT General Charges $OT Visit: 1 Procedure OT Evaluation $OT Eval Low Complexity: 1 Procedure OT Treatments $Self Care/Home Management : 8-22 mins G-Codes:    Evern Bio Shalea Tomczak OTR/L 05/09/2016, 4:13 PM [phone]

## 2016-05-09 NOTE — Progress Notes (Signed)
Subjective: 1 Day Post-Op Procedure(s) (LRB): TOTAL HIP ARTHROPLASTY ANTERIOR APPROACH (Right) Patient reports pain as moderate.  Course cough.   Objective: Vital signs in last 24 hours: Temp:  [97 F (36.1 C)-99.2 F (37.3 C)] 98.5 F (36.9 C) (08/05 0515) Pulse Rate:  [80-132] 80 (08/05 0935) Resp:  [15-28] 15 (08/05 0935) BP: (98-151)/(75-100) 122/80 (08/05 0935) SpO2:  [82 %-100 %] 91 % (08/05 0950)  Intake/Output from previous day: 08/04 0701 - 08/05 0700 In: 3165 [P.O.:240; I.V.:2925] Out: 700 [Urine:550; Blood:150] Intake/Output this shift: No intake/output data recorded.   Recent Labs  05/07/16 2250 05/08/16 0026 05/09/16 0427  HGB 13.1 13.2 10.5*    Recent Labs  05/08/16 0026 05/09/16 0427  WBC 8.7 7.5  RBC 4.18* 3.37*  HCT 38.8* 31.9*  PLT 234 185    Recent Labs  05/08/16 0026 05/09/16 0427  NA 134* 134*  K 3.9 3.6  CL 101 98*  CO2 23 27  BUN 11 7  CREATININE 0.78 0.52*  GLUCOSE 111* 105*  CALCIUM 9.3 8.6*   No results for input(s): LABPT, INR in the last 72 hours.  Neurologically intact  Assessment/Plan: 1 Day Post-Op Procedure(s) (LRB): TOTAL HIP ARTHROPLASTY ANTERIOR APPROACH (Right) Up with therapy  YATES,MARK C 05/09/2016, 10:57 AM

## 2016-05-09 NOTE — Progress Notes (Signed)
O2 sat low, gave 2 L oxygen by cannula. Placed SCD's.

## 2016-05-09 NOTE — Progress Notes (Signed)
Contacted respiratory therapy for albuterol treatment

## 2016-05-09 NOTE — Evaluation (Signed)
Physical Therapy Evaluation Patient Details Name: Julian Mejia MRN: [id number] DOB: 03/04/1951 Today's Date: 05/09/2016   History of Present Illness  Admitted s/p mechanical fall with hip fracture when walking the dog. Underwent R Anterior THA on 8/4. past medical history significant for COPD, HTN, early dementia, and smoking  Clinical Impression  Pt is s/p THA from fall resulting in the deficits listed below (see PT Problem List). Pt will benefit from skilled PT to increase their independence and safety with mobility. Due to cognitive issues (early dementia) and lack of 24 hour care at home pt would likely be best suited for short term rehab at [facility] where he can become independent with mobility again.     Follow Up Recommendations SNF;Supervision/Assistance - 24 hour    Equipment Recommendations  Rolling walker with 5" wheels    Recommendations for Other Services       Precautions / Restrictions Precautions Precautions: Fall Restrictions Weight Bearing Restrictions: Yes RLE Weight Bearing: Weight bearing as tolerated      Mobility  Bed Mobility Overal bed mobility: Needs Assistance Bed Mobility: Supine to Sit     Supine to sit: Mod assist     General bed mobility comments: increased time and 2 rest breaks before being able to sit up  Transfers Overall transfer level: Needs assistance Equipment used: Rolling walker (2 wheeled) Transfers: Sit to/from Stand Sit to Stand: Min assist;+2 safety/equipment;From elevated surface         General transfer comment: cues for technique and pt stillpulled up from walker to achieve standing  Ambulation/Gait Ambulation/Gait assistance: Min assist;+2 safety/equipment Ambulation Distance (Feet): 4 Feet Assistive device: Rolling walker (2 wheeled) Gait Pattern/deviations: Step-to pattern;Decreased stride length   Gait velocity interpretation: Below normal speed for age/gender General Gait Details: very slow and gaurded  gait  Stairs            Wheelchair Mobility    Modified Rankin (Stroke Patients Only)       Balance                                             Pertinent Vitals/Pain Pain Assessment: 0-10 Pain Score: 4  Pain Location: r hip Pain Intervention(s): Premedicated before session;Limited activity within patient's tolerance;Monitored during session    Home Living Family/patient expects to be discharged to:: Skilled nursing facility Living Arrangements: Spouse/significant other (Wife works and is not available 24 hours/day)               Additional Comments: Pt lives in 1 level house with 1 step entry. was independent with mobility PTA    Prior Function Level of Independence: Independent               Hand Dominance        Extremity/Trunk Assessment   Upper Extremity Assessment: Overall WFL for tasks assessed           Lower Extremity Assessment: RLE deficits/detail RLE Deficits / Details: decreased AROM and strength in hip and knee       Communication   Communication: No difficulties  Cognition Arousal/Alertness: Awake/alert Behavior During Therapy: WFL for tasks assessed/performed Overall Cognitive Status: History of cognitive impairments - at baseline                      General Comments General comments (skin integrity, edema, etc.): Pt's daughter  arrived at end of session and with pts permission shared his mobilty status and DC recommendations  Oxygen sats greater than 90% except when pt appeared to be holding his breath at times during mobility. Encouraged pt to breathe in through his nose.     Exercises        Assessment/Plan    PT Assessment Patient needs continued PT services  PT Diagnosis Difficulty walking;Acute pain   PT Problem List Decreased strength;Decreased range of motion;Decreased activity tolerance;Decreased balance;Decreased mobility;Decreased knowledge of use of DME;Decreased safety  awareness;Pain  PT Treatment Interventions DME instruction;Gait training;Therapeutic activities;Therapeutic exercise;Patient/family education   PT Goals (Current goals can be found in the Care Plan section) Acute Rehab PT Goals Patient Stated Goal: pt agreed to mve better PT Goal Formulation: With patient Time For Goal Achievement: 05/16/16 Potential to Achieve Goals: Good    Frequency Min 5X/week   Barriers to discharge        Co-evaluation               End of Session Equipment Utilized During Treatment: Gait belt;Oxygen Activity Tolerance: Patient tolerated treatment well Patient left: in chair;with call bell/phone within reach;with family/visitor present;with chair alarm set Nurse Communication: Mobility status         Time: 1121-6244 PT Time Calculation (min) (ACUTE ONLY): 23 min   Charges:   PT Evaluation $PT Eval Moderate Complexity: 1 Procedure PT Treatments $Gait Training: 8-22 mins   PT G Codes:        Donnella Sham 05/09/2016, 11:32 AM Lavona Mound, PT  [phone] 05/09/2016

## 2016-05-10 LAB — BASIC METABOLIC PANEL
Anion gap: 8 (ref 5–15)
BUN: 6 mg/dL (ref 6–20)
CHLORIDE: 97 mmol/L — AB (ref 101–111)
CO2: 27 mmol/L (ref 22–32)
CREATININE: 0.57 mg/dL — AB (ref 0.61–1.24)
Calcium: 8.4 mg/dL — ABNORMAL LOW (ref 8.9–10.3)
GFR calc Af Amer: >60 mL/min (ref >60)
GFR calc non Af Amer: >60 mL/min (ref >60)
GLUCOSE: 108 mg/dL — AB (ref 65–99)
POTASSIUM: 3.3 mmol/L — AB (ref 3.5–5.1)
Sodium: 132 mmol/L — ABNORMAL LOW (ref 135–145)

## 2016-05-10 LAB — CBC
HCT: 30.5 % — ABNORMAL LOW (ref 39.0–52.0)
HEMOGLOBIN: 10 g/dL — AB (ref 13.0–17.0)
MCH: 31.1 pg (ref 26.0–34.0)
MCHC: 32.8 g/dL (ref 30.0–36.0)
MCV: 94.7 fL (ref 78.0–100.0)
PLATELETS: 161 10*3/uL (ref 150–400)
RBC: 3.22 MIL/uL — ABNORMAL LOW (ref 4.22–5.81)
RDW: 13.8 % (ref 11.5–15.5)
WBC: 8.9 10*3/uL (ref 4.0–10.5)

## 2016-05-10 NOTE — Clinical Social Work Placement (Signed)
   CLINICAL SOCIAL WORK PLACEMENT  NOTE  Date:  05/10/2016  Patient Details  Name: Julian Mejia MRN: [id number] Date of Birth: Jul 31, 1951  Clinical Social Work is seeking post-discharge placement for this patient at the Skilled  Nursing Facility level of care (*CSW will initial, date and re-position this form in  chart as items are completed):  Yes   Patient/family provided with [facility] Clinical Social Work Department's list of facilities offering this level of care within the geographic area requested by the patient (or if unable, by the patient's family).  Yes   Patient/family informed of their freedom to choose among providers that offer the needed level of care, that participate in Medicare, Medicaid or managed care program needed by the patient, have an available bed and are willing to accept the patient.  Yes   Patient/family informed of [facility]'s ownership interest [facility] and [facility], as well as of the fact that they are under no obligation to receive care at these facilities.  PASRR submitted to EDS on 05/10/16     PASRR number received on 05/10/16     Existing PASRR number confirmed on       FL2 transmitted to all facilities in geographic area requested by pt/family on 05/10/16     FL2 transmitted to all facilities within larger geographic area on 05/10/16     Patient informed that his/her managed care company has contracts with or will negotiate with certain facilities, including the following:            Patient/family informed of bed offers received.  Patient chooses bed at       Physician recommends and patient chooses bed at      Patient to be transferred to   on  .  Patient to be transferred to facility by       Patient family notified on   of transfer.  Name of family member notified:        PHYSICIAN Please prepare priority discharge summary, including medications, Please prepare prescriptions, Please sign FL2     Additional  Comment:    _______________________________________________ Terald Sleeperakiyah T Jaide Hillenburg, LCSW 05/10/2016, 12:11 PM

## 2016-05-10 NOTE — Clinical Social Work Note (Signed)
Clinical Social Work Assessment  Patient Details  Name: Julian Mejia MRN: [id number] Date of Birth: [birth year]  Date of referral:  05/10/16               Reason for consult:  Discharge Planning                Permission sought to share information with:  Case Manager, Facility Medical sales representativeContact Representative, Family Supports Permission granted to share information::  No  Name::        Agency::   (snf)  Relationship::     Contact Information:     Housing/Transportation Living arrangements for the past 2 months:  Single Family Home Source of Information:  Medical Team, Spouse Patient Interpreter Needed:  None Criminal Activity/Legal Involvement Pertinent to Current Situation/Hospitalization:  No - Comment as needed Significant Relationships:  Other Family Members, Spouse Lives with:  Spouse Do you feel safe going back to the place where you live?  No Need for family participation in patient care:  Yes (Comment)  Care giving concerns: Pt unable to participate in accessment. No family at pt bedside.    Social Worker assessment / plan: CSW attempted to speak with pt family via phone and left a message. CSW will follow up with bed offers once available.   Employment status:  Retired Health and safety inspectornsurance information:  Medicare PT Recommendations:    Information / Referral to community resources:  Skilled Nursing Facility  Patient/Family's Response to care: Pt confused and unable to participate in assessment.   Patient/Family's Understanding of and Emotional Response to Diagnosis, Current Treatment, and Prognosis: pt confused and not able to participate in assessment.   Emotional Assessment Appearance:   (Unable to Access) Attitude/Demeanor/Rapport:  Unable to Assess Affect (typically observed):  Unable to Assess Orientation:  Oriented to Self Alcohol / Substance use:  Not Applicable Psych involvement (Current and /or in the community):  No (Comment)  Discharge Needs  Concerns to be addressed:   Discharge Planning Concerns Readmission within the last 30 days:    Current discharge risk:  None Barriers to Discharge:  Continued Medical Work up   Julian Julian [employer]akiyah T Ladeidra Borys, LCSW 05/10/2016, 11:52 AM

## 2016-05-10 NOTE — [place] FL2 (Signed)
[place] MEDICAID FL2 LEVEL OF CARE SCREENING TOOL     IDENTIFICATION  Patient Name: Julian Mejia Birthdate: Feb 03, 1951 Sex: male Admission Date (Current Location): 05/07/2016  [place]   Facility and Address:  [facility] [place], [address], [place], [place] [address]      Provider Number: [id number]  Attending Physician Name and Address:  Alison MurrayAlma M Devine, MD  Relative Name and Phone Number:       Current Level of Care: Hospital Recommended Level of Care: Skilled Nursing Facility Prior Approval Number:    Date Approved/Denied:   PASRR Number:   [id number]   Discharge Plan: SNF    Current Diagnoses: Patient Active Problem List   Diagnosis Date Noted  . Memory loss 05/08/2016  . Hyponatremia 05/08/2016  . Closed subcapital fracture of neck of right femur (HCC) 05/07/2016  . Essential hypertension 05/07/2016  . Chronic obstructive pulmonary disease (COPD) (HCC) 05/07/2016    Orientation RESPIRATION BLADDER Height & Weight     Self  O2 (3l/min) Continent Weight:   Height:     BEHAVIORAL SYMPTOMS/MOOD NEUROLOGICAL BOWEL NUTRITION STATUS   (None)  (None) Continent Diet (Regular)  AMBULATORY STATUS COMMUNICATION OF NEEDS Skin   Extensive Assist Verbally Surgical wounds                       Personal Care Assistance Level of Assistance  Bathing, Feeding, Dressing Bathing Assistance: Limited assistance Feeding assistance: Independent Dressing Assistance: Limited assistance     Functional Limitations Info  Sight, Hearing, Speech Sight Info: Adequate Hearing Info: Adequate Speech Info: Adequate    SPECIAL CARE FACTORS FREQUENCY  PT (By licensed PT)     PT Frequency:  (5x/week)              Contractures      Additional Factors Info  Code Status, Allergies Code Status Info:  (full) Allergies Info:  (NKDA)           Current Medications (05/10/2016):  This is the current hospital active  medication list Current Facility-Administered Medications  Medication Dose Route Frequency Provider Last Rate Last Dose  . 0.9 %  sodium chloride infusion   Intravenous Continuous Tarry KosNaiping M Xu, MD 125 mL/hr at 05/09/16 1605    . acetaminophen (TYLENOL) tablet 650 mg  650 mg Oral Q6H PRN Tarry KosNaiping M Xu, MD      . alum & mag hydroxide-simeth (MAALOX/MYLANTA) 200-200-20 MG/5ML suspension 30 mL  30 mL Oral Q4H PRN Naiping Donnelly StagerM Xu, MD      . amLODipine (NORVASC) tablet 10 mg  10 mg Oral Daily Naiping Donnelly StagerM Xu, MD   10 mg at 05/10/16 1001  . bisacodyl (DULCOLAX) suppository 10 mg  10 mg Rectal Daily PRN Alberteen Samhristopher P Danford, MD      . diphenhydrAMINE (BENADRYL) capsule 25 mg  25 mg Oral Q8H PRN Alison MurrayAlma M Devine, MD   25 mg at 05/08/16 2234  . docusate sodium (COLACE) capsule 100 mg  100 mg Oral BID Alberteen Samhristopher P Danford, MD   100 mg at 05/10/16 0959  . donepezil (ARICEPT) tablet 10 mg  10 mg Oral QHS Tarry KosNaiping M Xu, MD   10 mg at 05/09/16 2040  . enoxaparin (LOVENOX) injection 40 mg  40 mg Subcutaneous Q24H Naiping Donnelly StagerM Xu, MD   40 mg at 05/10/16 1000  . feeding supplement (ENSURE ENLIVE) (ENSURE ENLIVE) liquid 237 mL  237 mL Oral BID BM Alma  Concepcion Elk, MD   237 mL at 05/10/16 1610  . folic acid (FOLVITE) tablet 1 mg  1 mg Oral Daily Alberteen Sam, MD   1 mg at 05/10/16 0959  . lactated ringers infusion   Intravenous Continuous Elyn Peers, CRNA      . LORazepam (ATIVAN) tablet 1 mg  1 mg Oral Q6H PRN Alberteen Sam, MD       Or  . LORazepam (ATIVAN) injection 1 mg  1 mg Intravenous Q6H PRN Alberteen Sam, MD      . losartan (COZAAR) tablet 50 mg  50 mg Oral Daily Naiping Donnelly Stager, MD   50 mg at 05/10/16 1001  . menthol-cetylpyridinium (CEPACOL) lozenge 3 mg  1 lozenge Oral PRN Naiping Donnelly Stager, MD       Or  . phenol (CHLORASEPTIC) mouth spray 1 spray  1 spray Mouth/Throat PRN Naiping Donnelly Stager, MD      . methocarbamol (ROBAXIN) tablet 500 mg  500 mg Oral Q6H PRN Tarry Kos, MD   500 mg at 05/09/16 1512   . metoCLOPramide (REGLAN) tablet 5-10 mg  5-10 mg Oral Q8H PRN Naiping Donnelly Stager, MD       Or  . metoCLOPramide (REGLAN) injection 5-10 mg  5-10 mg Intravenous Q8H PRN Naiping Donnelly Stager, MD      . metoprolol (LOPRESSOR) tablet 50 mg  50 mg Oral Daily Naiping Donnelly Stager, MD   50 mg at 05/10/16 1001  . morphine 2 MG/ML injection 0.5 mg  0.5 mg Intravenous Q2H PRN Tarry Kos, MD   0.5 mg at 05/08/16 1505  . multivitamin with minerals tablet 1 tablet  1 tablet Oral Daily Alberteen Sam, MD   1 tablet at 05/10/16 0959  . ondansetron (ZOFRAN) tablet 4 mg  4 mg Oral Q6H PRN Naiping Donnelly Stager, MD       Or  . ondansetron [facility]) injection 4 mg  4 mg Intravenous Q6H PRN Naiping Donnelly Stager, MD      . oxyCODONE (Oxy IR/ROXICODONE) immediate release tablet 5-10 mg  5-10 mg Oral Q4H PRN Tarry Kos, MD   10 mg at 05/09/16 2040  . polyethylene glycol (MIRALAX / GLYCOLAX) packet 17 g  17 g Oral Daily PRN Alberteen Sam, MD      . thiamine (VITAMIN B-1) tablet 100 mg  100 mg Oral Daily Alberteen Sam, MD   100 mg at 05/10/16 0959  . tiotropium (SPIRIVA) inhalation capsule 18 mcg  18 mcg Inhalation Daily Alberteen Sam, MD   18 mcg at 05/10/16 0806     Discharge Medications: Please see discharge summary for a list of discharge medications.  Relevant Imaging Results:  Relevant Lab Results:   Additional Information SS#:  Terald Sleeper, LCSW

## 2016-05-10 NOTE — Progress Notes (Signed)
Physical Therapy Treatment Patient Details Name: Julian Mejia MRN: [id number] DOB: April 27, 1951 Today's Date: 05/10/2016    History of Present Illness Admitted s/p mechanical fall with hip fracture when walking the dog. Underwent R Anterior THA on 8/4. past medical history significant for COPD, HTN, early dementia, and smoking    PT Comments    Improved mobility today however continue to recommend SNF for ongoing Physical Therapy.    Follow Up Recommendations  SNF;Supervision/Assistance - 24 hour     Equipment Recommendations  Rolling walker with 5" wheels    Recommendations for Other Services       Precautions / Restrictions Precautions Precautions: Fall Restrictions Weight Bearing Restrictions: Yes RLE Weight Bearing: Weight bearing as tolerated    Mobility  Bed Mobility Overal bed mobility: Needs Assistance Bed Mobility: Supine to Sit     Supine to sit: Min assist (Needs assist with RLE and increased time)        Transfers Overall transfer level: Needs assistance Equipment used: Rolling walker (2 wheeled) Transfers: Sit to/from Stand Sit to Stand: Min assist         General transfer comment: cues for hand and LE placement  Ambulation/Gait Ambulation/Gait assistance: Min assist;+2 safety/equipment Ambulation Distance (Feet): 25 Feet Assistive device: Rolling walker (2 wheeled) Gait Pattern/deviations: Step-to pattern;Decreased stride length   Gait velocity interpretation: Below normal speed for age/gender General Gait Details: needed frequent verball cues and physical guidance with walker for sequencing   Stairs            Wheelchair Mobility    Modified Rankin (Stroke Patients Only)       Balance                                    Cognition Arousal/Alertness: Awake/alert Behavior During Therapy: WFL for tasks assessed/performed Overall Cognitive Status: History of cognitive impairments - at baseline                       Exercises Total Joint Exercises Ankle Circles/Pumps: AROM;Both;10 reps;Supine Heel Slides: AAROM;Right;10 reps;Supine Hip ABduction/ADduction: AAROM;Right;10 reps;Supine    General Comments General comments (skin integrity, edema, etc.): No family present. Pt on 3 L oxygen today      Pertinent Vitals/Pain Pain Assessment: 0-10 Pain Score: 6  Pain Location: r hip. pain increases with coughing Pain Intervention(s): Limited activity within patient's tolerance;Monitored during session    Home Living                      Prior Function            PT Goals (current goals can now be found in the care plan section) Progress towards PT goals: Progressing toward goals    Frequency  Min 5X/week    PT Plan Current plan remains appropriate    Co-evaluation             End of Session Equipment Utilized During Treatment: Gait belt;Oxygen Activity Tolerance: Patient tolerated treatment well Patient left: in chair;with call bell/phone within reach;with chair alarm set     Time: [id number] PT Time Calculation (min) (ACUTE ONLY): 19 min  Charges:  $Gait Training: 8-22 mins                    G Codes:      Julian Mejia, Julian Mejia 05/10/2016, 10:37 AM Julian Mejia, PT  [phone]  05/10/2016   

## 2016-05-10 NOTE — Progress Notes (Signed)
Subjective: 2 Days Post-Op Procedure(s) (LRB): TOTAL HIP ARTHROPLASTY ANTERIOR APPROACH (Right) Patient reports pain as moderate.    Objective: Vital signs in last 24 hours: Temp:  [98.5 F (36.9 C)-98.7 F (37.1 C)] 98.5 F (36.9 C) (08/06 0549) Pulse Rate:  [87-89] 89 (08/06 1000) BP: (114-126)/(73-81) 123/81 (08/06 1000) SpO2:  [98 %-99 %] 99 % (08/06 1000)  Intake/Output from previous day: 08/05 0701 - 08/06 0700 In: 440 [P.O.:440] Out: 600 [Urine:600] Intake/Output this shift: Total I/O In: 120 [P.O.:120] Out: 100 [Urine:100]   Recent Labs  05/07/16 2250 05/08/16 0026 05/09/16 0427 05/10/16 0551  HGB 13.1 13.2 10.5* 10.0*    Recent Labs  05/09/16 0427 05/10/16 0551  WBC 7.5 8.9  RBC 3.37* 3.22*  HCT 31.9* 30.5*  PLT 185 161    Recent Labs  05/09/16 0427 05/10/16 0551  NA 134* 132*  K 3.6 3.3*  CL 98* 97*  CO2 27 27  BUN 7 6  CREATININE 0.52* 0.57*  GLUCOSE 105* 108*  CALCIUM 8.6* 8.4*   No results for input(s): LABPT, INR in the last 72 hours.  Neurologically intact  Assessment/Plan: 2 Days Post-Op Procedure(s) (LRB): TOTAL HIP ARTHROPLASTY ANTERIOR APPROACH (Right) Up with therapy . Moving only a few steps. Progress with therapy is slow. Hgb is stable .   Julian Mejia C 05/10/2016, 2:04 PM

## 2016-05-10 NOTE — Progress Notes (Addendum)
Patient ID: Julian Mejia, male   DOB: 04-20-1951, 65 y.o.   MRN: [id number]  PROGRESS NOTE    Julian Mejia  [id number] DOB: 04-20-1951 DOA: 05/07/2016  PCP: Sharlene MottsGuhu,Bhuvana, MD   Brief Narrative:   65 y.o. male with past medical history of hypertension, memory loss who [facility] post fall. Patient sustained right hip fracture. Patient seen by orthopedic surgery consultation.  Assessment & Plan:   Principal Problem:   Closed subcapital fracture of neck of right femur (HCC) / Mechanical fall - Status post mechanical fall. 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 - D/C to SNF once cleared by ortho - Continue pain management efforts  Active Problems:   Acute blood loss anemia - Postoperative - Hgb down from 13.2 to 10.5; repeat level in am 10.0 so overall stable since surgery and no need for trnasfusion    Essential hypertension - Continue Norvasc, metoprolol, losartan     Acute respiratory failure with hypoxia / COPD - Hypoxia on the admission, oxygen saturation 89% on room air but has improved with nasal cannula oxygen support to 98% - Stable    Memory loss - Stable  - Continue aricept    DVT prophylaxis: Lovenox subQ  Code Status: full code  Family Communication: no family at the bedside this am  Disposition Plan: home or SNF once stable from ortho point    Consultants:   Orthopedic surgery  Procedures:   Total hip replacement 05/08/2016  Antimicrobials:   Vancomycin and cefazolin preoperatively    Subjective: No overnight events.    Objective: Vitals:   05/09/16 2025 05/10/16 0549 05/10/16 1000 05/10/16 1432  BP: 114/80 126/77 123/81 103/62  Pulse: 88 87 89 81  Resp:      Temp: 98.7 F (37.1 C) 98.5 F (36.9 C)  98.5 F (36.9 C)  TempSrc: Oral Oral  Oral  SpO2: 98% 98% 99% 93%    Intake/Output Summary (Last 24 hours) at 05/10/16 1539 Last data filed at 05/10/16 1047  Gross per 24 hour  Intake              360 ml    Output              300 ml  Net               60 ml   There were no vitals filed for this visit.  Examination:  General exam: No distress, calm and comdortable Respiratory system: No rhonchi, bilateral air entry Cardiovascular system: S1 & S2 heard, Rate controlled  Gastrointestinal system: Abdomen is nondistended, appreciate bowel sounds  Central nervous system: No focal neurological deficits. Extremities: Symmetric, no tenderness Skin: no lesions or ulcers  Psychiatry: No agitation, no restlesness  Data Reviewed: I have personally reviewed following labs and imaging studies  CBC:  Recent Labs Lab 05/07/16 2250 05/08/16 0026 05/09/16 0427 05/10/16 0551  WBC 9.8 8.7 7.5 8.9  NEUTROABS 7.7  --   --   --   HGB 13.1 13.2 10.5* 10.0*  HCT 38.2* 38.8* 31.9* 30.5*  MCV 92.9 92.8 94.7 94.7  PLT 226 234 185 161   Basic Metabolic Panel:  Recent Labs Lab 05/07/16 2250 05/08/16 0026 05/09/16 0427 05/10/16 0551  NA 130* 134* 134* 132*  K 3.9 3.9 3.6 3.3*  CL 98* 101 98* 97*  CO2 21* 23 27 27   GLUCOSE 108* 111* 105* 108*  BUN 11 11 7 6   CREATININE 0.77 0.78 0.52* 0.57*  CALCIUM  9.1 9.3 8.6* 8.4*   GFR: CrCl cannot be calculated (Unknown ideal weight.). Liver Function Tests: No results for input(s): AST, ALT, ALKPHOS, BILITOT, PROT, ALBUMIN in the last 168 hours. No results for input(s): LIPASE, AMYLASE in the last 168 hours. No results for input(s): AMMONIA in the last 168 hours. Coagulation Profile: No results for input(s): INR, PROTIME in the last 168 hours. Cardiac Enzymes: No results for input(s): CKTOTAL, CKMB, CKMBINDEX, TROPONINI in the last 168 hours. BNP (last 3 results) No results for input(s): PROBNP in the last 8760 hours. HbA1C: No results for input(s): HGBA1C in the last 72 hours. CBG: No results for input(s): GLUCAP in the last 168 hours. Lipid Profile: No results for input(s): CHOL, HDL, LDLCALC, TRIG, CHOLHDL, LDLDIRECT in the last 72  hours. Thyroid Function Tests: No results for input(s): TSH, T4TOTAL, FREET4, T3FREE, THYROIDAB in the last 72 hours. Anemia Panel: No results for input(s): VITAMINB12, FOLATE, FERRITIN, TIBC, IRON, RETICCTPCT in the last 72 hours. Urine analysis: No results found for: COLORURINE, APPEARANCEUR, LABSPEC, PHURINE, GLUCOSEU, HGBUR, BILIRUBINUR, KETONESUR, PROTEINUR, UROBILINOGEN, NITRITE, LEUKOCYTESUR Sepsis Labs: @LABRCNTIP (procalcitonin:4,lacticidven:4)   Recent Results (from the past 240 hour(s))  Surgical PCR screen     Status: None   Collection Time: 05/08/16  7:04 AM  Result Value Ref Range Status   MRSA, PCR NEGATIVE NEGATIVE Final   Staphylococcus aureus NEGATIVE NEGATIVE Final      Radiology Studies:  Chest Portable 1 View Result Date: 05/07/2016 CLINICAL DATA:   1.  No acute cardiopulmonary abnormality.  Pulmonary hyperinflation. 2. Significantly progressed tortuosity of the thoracic aorta since 2010. Consider non-emergent imaging followup by CTA or MRA.   Dg Hip Unilat  With Pelvis 2-3 Views Right Result Date: 05/07/2016 CLINICAL DATA: Acute subcapital fracture of the right femoral neck with slight impaction. Electronically Signed   By: Rise Mu M.D.   On: 05/07/2016 19:18   Pelvis Portable Result Date: 05/08/2016 CLINICAL DATA:  Right hip replacement.  No complicating feature Electronically Signed   By: Charlett Nose M.D.   On: 05/08/2016 13:55   Dg C-arm 61-120 Min Result Date: 05/08/2016 CLINICAL DATA: Anatomic alignment in the AP projection post right total hip arthroplasty. Electronically Signed   By: Hulan Saas M.D.   On: 05/08/2016 12:40   Dg Hip Operative Unilat With Pelvis Right Result Date: 05/08/2016 CLINICAL DATA:  Anatomic alignment in the AP projection post right total hip arthroplasty. Electronically Signed   By: Hulan Saas M.D.   On: 05/08/2016 12:40        Scheduled Meds: . amLODipine  10 mg Oral Daily  . docusate sodium  100 mg  Oral BID  . donepezil  10 mg Oral QHS  . enoxaparin (LOVENOX) injection  40 mg Subcutaneous Q24H  . feeding supplement (ENSURE ENLIVE)  237 mL Oral BID BM  . folic acid  1 mg Oral Daily  . losartan  50 mg Oral Daily  . metoprolol  50 mg Oral Daily  . multivitamin with minerals  1 tablet Oral Daily  . thiamine  100 mg Oral Daily  . tiotropium  18 mcg Inhalation Daily   Continuous Infusions: . sodium chloride 125 mL/hr at 05/09/16 1605  . lactated ringers       LOS: 3 days    Time spent: 15 minutes  Greater than 50% of the time spent on counseling and coordinating the care.   Manson Passey, MD [facility] Pager [phone]  If 7PM-7AM, please contact night-coverage [email or web address]  Password [email or web address] 05/10/2016, 3:39 PM

## 2016-05-11 ENCOUNTER — Encounter (HOSPITAL_COMMUNITY): Payer: Self-pay | Admitting: Orthopaedic Surgery

## 2016-05-11 LAB — CBC
HCT: 29.8 % — ABNORMAL LOW (ref 39.0–52.0)
Hemoglobin: 10 g/dL — ABNORMAL LOW (ref 13.0–17.0)
MCH: 31.9 pg (ref 26.0–34.0)
MCHC: 33.6 g/dL (ref 30.0–36.0)
MCV: 95.2 fL (ref 78.0–100.0)
PLATELETS: 192 10*3/uL (ref 150–400)
RBC: 3.13 MIL/uL — AB (ref 4.22–5.81)
RDW: 14 % (ref 11.5–15.5)
WBC: 9.4 10*3/uL (ref 4.0–10.5)

## 2016-05-11 MED ORDER — POTASSIUM CHLORIDE CRYS ER 20 MEQ PO TBCR
40.0000 meq | EXTENDED_RELEASE_TABLET | Freq: Once | ORAL | Status: AC
  Administered 2016-05-11: 40 meq via ORAL
  Filled 2016-05-11: qty 2

## 2016-05-11 NOTE — Clinical Social Work Placement (Signed)
   CLINICAL SOCIAL WORK PLACEMENT  NOTE  Date:  05/11/2016  Patient Details  Name: Julian SchoolDennis Scheidt MRN: [id number] Date of Birth: 08-27-1951  Clinical Social Work is seeking post-discharge placement for this patient at the Skilled  Nursing Facility level of care (*CSW will initial, date and re-position this form in  chart as items are completed):  Yes   Patient/family provided with [place] Clinical Social Work Department's list of facilities offering this level of care within the geographic area requested by the patient (or if unable, by the patient's family).  Yes   Patient/family informed of their freedom to choose among providers that offer the needed level of care, that participate in Medicare, Medicaid or managed care program needed by the patient, have an available bed and are willing to accept the patient.  Yes   Patient/family informed of 's ownership interest in [facility] and [facility], as well as of the fact that they are under no obligation to receive care at these facilities.  PASRR submitted to EDS on 05/10/16     PASRR number received on 05/10/16     Existing PASRR number confirmed on       FL2 transmitted to all facilities in geographic area requested by pt/family on 05/10/16     FL2 transmitted to all facilities within larger geographic area on 05/10/16     Patient informed that his/her managed care company has contracts with or will negotiate with certain facilities, including the following:        Yes   Patient/family informed of bed offers received.  Patient [facility] bed [facility]     Physician recommends and patient chooses bed at      Patient to be transferred to [facility], [place] on 05/11/16.  Patient to be transferred to facility by Wife     Patient family notified on 05/11/16 of transfer.  Name of family member notified:  Wife, Vic RipperSheryl Hartney     PHYSICIAN Please prepare priority discharge summary, including medications,  Please prepare prescriptions, Please sign FL2     Additional Comment:    _______________________________________________ Rod MaeVaughn, Norene Oliveri S, LCSW 05/11/2016, 12:08 PM

## 2016-05-11 NOTE — Discharge Summary (Signed)
Physician Discharge Summary  Pratyush Ammon RUE:[id number] DOB: 03-24-1951 DOA: 05/07/2016  PCP: Sharlene Motts, MD  Admit date: 05/07/2016 Discharge date: 05/11/2016  Recommendations for Outpatient Follow-up:  1. Lovenox for DVT prophylaxis per orthopedic surgery.  Discharge Diagnoses:  Principal Problem:   Closed subcapital fracture of neck of right femur (HCC) Active Problems:   Essential hypertension   Chronic obstructive pulmonary disease (COPD) (HCC)   Memory loss   Hyponatremia    Discharge Condition: stable   Diet recommendation: as tolerated   History of present illness:  65 y.o.malewith past medical history of hypertension, memory loss who presented to [facility] status post fall. Patient sustained right hip fracture. Patient seen by orthopedic surgery consultation. Patient underwent total hip replacement  Hospital Course:   Assessment & Plan:   Principal Problem:   Closed subcapital fracture of neck of right femur (HCC) / Mechanical fall - Status post mechanical fall. 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 - Continue pain management efforts - Lovenox for DVT prophylaxis per ortho on discharge   Active Problems:   Acute blood loss anemia - Postoperative - Hgb down from 13.2 to 10.5; repeat level in am 10.0  X 2 so overall stable     Essential hypertension - Continue Norvasc, metoprolol, losartan     Acute respiratory failure with hypoxia / COPD - Hypoxia on the admission, oxygen saturation 89% on room air but has improved with nasal cannula oxygen support to 98% - Stable    Memory loss - Stable  - Continue aricept    DVT prophylaxis: Lovenox subQ  Code Status: full code  Family Communication: no family at the bedside this am     Consultants:   Orthopedic surgery  Procedures:   Total hip replacement 05/08/2016  Antimicrobials:   Vancomycin and cefazolin preoperatively   Signed:  Manson Passey, MD  [facility] 05/11/2016, 10:25 AM  Pager #: [phone]  Time spent in minutes: less than 30 minutes  Discharge Exam: Vitals:   05/11/16 0407 05/11/16 0920  BP: 131/75   Pulse: 95 82  Resp:  16  Temp: 99.3 F (37.4 C)    Vitals:   05/10/16 1432 05/10/16 2141 05/11/16 0407 05/11/16 0920  BP: 103/62 119/75 131/75   Pulse: 81 97 95 82  Resp:    16  Temp: 98.5 F (36.9 C) 98.8 F (37.1 C) 99.3 F (37.4 C)   TempSrc: Oral Oral Oral   SpO2: 93%  96% 92%    General: Pt is alert, follows commands appropriately, not in acute distress Cardiovascular: Regular rate and rhythm, S1/S2 +, no murmurs Respiratory: Clear to auscultation bilaterally, no wheezing, no crackles, no rhonchi Abdominal: Soft, non tender, non distended, bowel sounds +, no guarding Extremities: no edema, pulses palpable bilaterally DP and PT Neuro: Grossly nonfocal  Discharge Instructions  Discharge Instructions    Call MD for:  persistant nausea and vomiting    Complete by:  As directed   Call MD for:  redness, tenderness, or signs of infection (pain, swelling, redness, odor or green/yellow discharge around incision site)    Complete by:  As directed   Call MD for:  severe uncontrolled pain    Complete by:  As directed   Diet - low sodium heart healthy    Complete by:  As directed   Increase activity slowly    Complete by:  As directed   Weight bearing as tolerated    Complete by:  As directed  Medication List    TAKE these medications   albuterol 108 (90 Base) MCG/ACT inhaler Commonly known as:  PROVENTIL HFA;VENTOLIN HFA Inhale 2 puffs into the lungs every 6 (six) hours as needed for wheezing or shortness of breath.   alendronate 70 MG tablet Commonly known as:  FOSAMAX Take 70 mg by mouth every Friday. Take with a full glass of water on an empty stomach.   amLODipine 5 MG tablet Commonly known as:  NORVASC Take 10 mg by mouth daily.   cholecalciferol 1000 units tablet Commonly known as:   VITAMIN D Take 2,000 Units by mouth daily.   donepezil 5 MG tablet Commonly known as:  ARICEPT Take 10 mg by mouth daily.   enoxaparin 40 MG/0.4ML injection Commonly known as:  LOVENOX Inject 0.4 mLs (40 mg total) into the skin daily.   losartan 50 MG tablet Commonly known as:  COZAAR Take 50 mg by mouth daily.   metoprolol 50 MG tablet Commonly known as:  LOPRESSOR Take 50 mg by mouth daily.   oxyCODONE 5 MG immediate release tablet Commonly known as:  Oxy IR/ROXICODONE Take 1-3 tablets (5-15 mg total) by mouth every 4 (four) hours as needed.   tiotropium 18 MCG inhalation capsule Commonly known as:  SPIRIVA Place 18 mcg into inhaler and inhale daily.      Follow-up Information    Cheral AlmasXu, Naiping Michael, MD Follow up in 2 week(s).   Specialty:  Orthopedic Surgery Why:  For suture removal, For wound re-check Contact information: [address] [place] [id number] [phone]            The results of significant diagnostics from this hospitalization (including imaging, microbiology, ancillary and laboratory) are listed below for reference.    Significant Diagnostic Studies: Pelvis Portable  Result Date: 05/08/2016 CLINICAL DATA:  Right hip replacement. EXAM: PORTABLE PELVIS 1-2 VIEWS COMPARISON:  05/07/2016 FINDINGS: Changes of right hip replacement. Normal AP alignment. No hardware or bony complicating feature. IMPRESSION: Right hip replacement.  No complicating feature Electronically Signed   By: Charlett NoseKevin  Dover M.D.   On: 05/08/2016 13:55   Chest Portable 1 View  Result Date: 05/07/2016 CLINICAL DATA:  65 year old male preoperative study for hip fracture. Initial encounter. COPD EXAM: PORTABLE CHEST 1 VIEW COMPARISON:  07/27/2009. FINDINGS: Portable AP semi upright view at 2256 hours. Increased and moderate to severe tortuosity of the thoracic aorta. Other mediastinal contours are within normal limits. Visualized tracheal air column is within normal limits.  Allowing for portable technique, the lungs are clear. Chronic hyperinflation. No pneumothorax or pleural effusion. IMPRESSION: 1.  No acute cardiopulmonary abnormality.  Pulmonary hyperinflation. 2. Significantly progressed tortuosity of the thoracic aorta since 2010. Consider non-emergent imaging followup by CTA or MRA. Electronically Signed   By: Odessa FlemingH  Hall M.D.   On: 05/07/2016 23:10   Dg C-arm 61-120 Min  Result Date: 05/08/2016 CLINICAL DATA:  Anterior approach right total hip arthroplasty. EXAM: OPERATIVE RIGHT HIP (WITH PELVIS IF PERFORMED) 1 VIEW TECHNIQUE: Fluoroscopic spot image(s) were submitted for interpretation post-operatively. COMPARISON:  Right hip x-rays yesterday. FINDINGS: Right total hip arthroplasty with anatomic alignment in the AP projection. IMPRESSION: Anatomic alignment in the AP projection post right total hip arthroplasty. Electronically Signed   By: Hulan Saashomas  Lawrence M.D.   On: 05/08/2016 12:40   Dg Hip Operative Unilat With Pelvis Right  Result Date: 05/08/2016 CLINICAL DATA:  Anterior approach right total hip arthroplasty. EXAM: OPERATIVE RIGHT HIP (WITH PELVIS IF PERFORMED) 1 VIEW TECHNIQUE:  Fluoroscopic spot image(s) were submitted for interpretation post-operatively. COMPARISON:  Right hip x-rays yesterday. FINDINGS: Right total hip arthroplasty with anatomic alignment in the AP projection. IMPRESSION: Anatomic alignment in the AP projection post right total hip arthroplasty. Electronically Signed   By: Hulan Saas M.D.   On: 05/08/2016 12:40   Dg Hip Unilat  With Pelvis 2-3 Views Right  Result Date: 05/07/2016 CLINICAL DATA:  Initial evaluation for acute trauma, fall. Acute right-sided hip pain. EXAM: DG HIP (WITH OR WITHOUT PELVIS) 2-3V RIGHT COMPARISON:  None. FINDINGS: There is an acute subcapital fracture through the right femoral neck with slight impaction. Femoral head remains normally position within the acetabulum. Visualized right femur are otherwise intact.  Bony pelvis intact without associated fracture. The SI joints approximated. Limited views of the left hip grossly unremarkable. 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 Scattered vascular calcifications noted within the proximal thighs. IMPRESSION: Acute subcapital fracture of the right femoral neck with slight impaction. Electronically Signed   By: Rise Mu M.D.   On: 05/07/2016 19:18    Microbiology: Recent Results (from the past 240 hour(s))  Surgical PCR screen     Status: None   Collection Time: 05/08/16  7:04 AM  Result Value Ref Range Status   MRSA, PCR NEGATIVE NEGATIVE Final   Staphylococcus aureus NEGATIVE NEGATIVE Final    Comment:        The Xpert SA Assay (FDA approved for NASAL specimens in patients over 42 years of age), is one component of a comprehensive surveillance program.  Test performance has been validated by [facility] for patients greater than or equal to 31 year old. It is not intended to diagnose infection nor to guide or monitor treatment.      Labs: Basic Metabolic Panel:  Recent Labs Lab 05/07/16 2250 05/08/16 0026 05/09/16 0427 05/10/16 0551  NA 130* 134* 134* 132*  K 3.9 3.9 3.6 3.3*  CL 98* 101 98* 97*  CO2 21* GLUCOSE 108* 111* 105* 108*  BUN CREATININE 0.77 0.78 0.52* 0.57*  CALCIUM 9.1 9.3 8.6* 8.4*   Liver Function Tests: No results for input(s): AST, ALT, ALKPHOS, BILITOT, PROT, ALBUMIN in the last 168 hours. No results for input(s): LIPASE, AMYLASE in the last 168 hours. No results for input(s): AMMONIA in the last 168 hours. CBC:  Recent Labs Lab 05/07/16 2250 05/08/16 0026 05/09/16 0427 05/10/16 0551 05/11/16 0432  WBC 9.8 8.7 7.5 8.9 9.4  NEUTROABS 7.7  --   --   --   --   HGB 13.1 13.2 10.5* 10.0* 10.0*  HCT 38.2* 38.8* 31.9* 30.5* 29.8*  MCV 92.9 92.8 94.7 94.7 95.2  PLT 226 234 185 161 192   Cardiac Enzymes: No results for input(s): CKTOTAL, CKMB,  CKMBINDEX, TROPONINI in the last 168 hours. BNP: BNP (last 3 results) No results for input(s): BNP in the last 8760 hours.  ProBNP (last 3 results) No results for input(s): PROBNP in the last 8760 hours.  CBG: No results for input(s): GLUCAP in the last 168 hours.

## 2016-05-11 NOTE — Care Management Important Message (Signed)
Important Message  Patient Details  Name: Julian Mejia MRN: [id number] Date of Birth: 05-Nov-1950   Medicare Important Message Given:  Yes    Bernadette HoitShoffner, Allan Minotti Coleman 05/11/2016, 2:00 PM

## 2016-05-11 NOTE — Clinical Social Work Note (Signed)
LCSW received handoff stating patient ready for discharge and family to be provided with bed offers. LCSW spoke with patient's wife, Julian Mejia, who stated she would prefer for patient to be discharged to 1. [facility] or [facility]. [facility]. LCSW confirmed bed availability with [facility]. LCSW returned patient's wife call and left message [facility]. RN updated.  Patient to be discharged to [facility]. Patient and patient's wife updated regarding discharge. Patient to be transported by patient's wife at the end of the work day. RN report number: [id number] Room Number: [address]  Julian Mejia, [place] [phone] Orthopedic Social Worker

## 2016-05-11 NOTE — Progress Notes (Signed)
Physical Therapy Treatment Patient Details Name: Julian Mejia MRN: [id number] DOB: 1950/12/24 Today's Date: 05/11/2016    History of Present Illness Admitted s/p mechanical fall with hip fracture when walking the dog. Underwent R Anterior THA on 8/4. past medical history significant for COPD, HTN, early dementia, and smoking    PT Comments    Continue to recommend SNF for ongoing Physical Therapy.  Pt is making slow, steady progress.    Follow Up Recommendations  SNF;Supervision/Assistance - 24 hour     Equipment Recommendations  Rolling walker with 5" wheels    Recommendations for Other Services       Precautions / Restrictions Precautions Precautions: Fall Restrictions Weight Bearing Restrictions: Yes RLE Weight Bearing: Weight bearing as tolerated    Mobility  Bed Mobility         Supine to sit:  (Needs assist with RLE and increased time)     General bed mobility comments: NT, pt sitting on EOB when PT arrived  Transfers Overall transfer level: Needs assistance Equipment used: Rolling walker (2 wheeled) Transfers: Sit to/from Stand Sit to Stand: Min assist         General transfer comment: Practiced transfers several times from bed, BSC,recliner chair.  Pt tends to place a hand on the walker to push up unless cued not to do so  Ambulation/Gait Ambulation/Gait assistance: Min assist Ambulation Distance (Feet): 50 Feet Assistive device: Rolling walker (2 wheeled) Gait Pattern/deviations: Step-to pattern;Decreased stride length Gait velocity: decreased Gait velocity interpretation: Below normal speed for age/gender General Gait Details: Less cues required than yesterday, but still required cues especially to roll walker and not pick it up and too not to get too close to the front of the walker. Pt able to raise RLE with gait more then yesterday.    Stairs            Wheelchair Mobility    Modified Rankin (Stroke Patients Only)       Balance                                     Cognition Arousal/Alertness: Awake/alert Behavior During Therapy: WFL for tasks assessed/performed Overall Cognitive Status: History of cognitive impairments - at baseline                      Exercises Total Joint Exercises Ankle Circles/Pumps: AROM;Both;10 reps;Seated Long Arc Quad: AAROM;Right;10 reps;Seated Marching in Standing: AROM;Right;10 reps;Standing (limited in AROM)    General Comments General comments (skin integrity, edema, etc.): No family present. 02 sats 90% after walking to and from the bathroom without oxygen. With oxygen on pt increased to 95%.       Pertinent Vitals/Pain Pain Assessment: 0-10 Pain Score: 2  Pain Location: right hip Pain Intervention(s): Limited activity within patient's tolerance;Monitored during session    Home Living                      Prior Function            PT Goals (current goals can now be found in the care plan section) Progress towards PT goals: Progressing toward goals    Frequency  Min 5X/week    PT Plan Current plan remains appropriate    Co-evaluation             End of Session Equipment Utilized During Treatment: Gait belt;Oxygen Activity Tolerance:  Patient tolerated treatment well Patient left: in chair;with call bell/phone within reach;with chair alarm set     Time: [id number] PT Time Calculation (min) (ACUTE ONLY): 27 min  Charges:  $Gait Training: 8-22 mins $Therapeutic Exercise: 8-22 mins                    G Codes:      Donnella ShamSawulski, Knoah Nedeau J 05/11/2016, 10:24 AM Lavona MoundMark Darris Carachure, PT  [phone] 05/11/2016

## 2016-05-11 NOTE — Discharge Instructions (Signed)
° ° °  1. Change dressings as needed °2. May shower but keep incisions covered and dry °3. Take lovenox to prevent blood clots °4. Take stool softeners as needed °5. Take pain meds as needed ° °

## 2016-10-23 IMAGING — CR DG PORTABLE PELVIS
1 series · 1 of 1 positions shown · non-contrast
Comparison: 05/07/2016

CLINICAL DATA: Right hip replacement.

EXAM:
PORTABLE PELVIS 1-2 VIEWS

[AP]
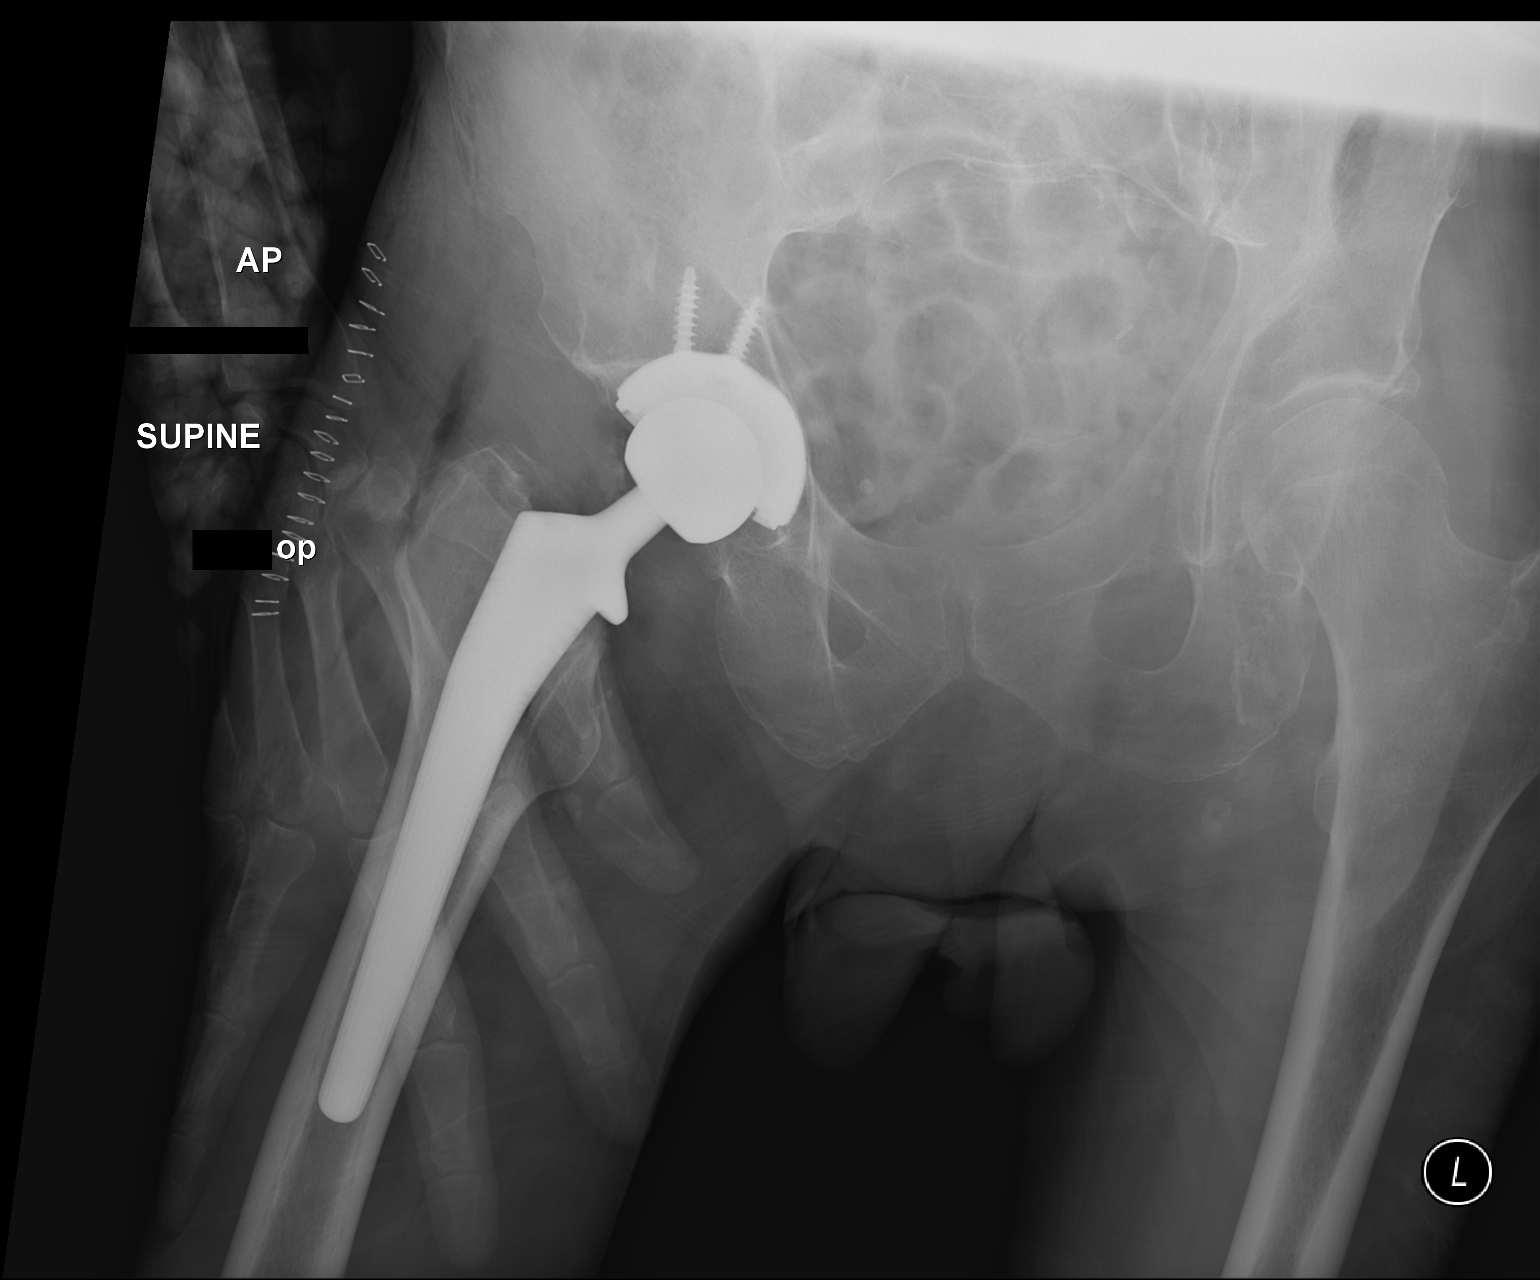

[1 of 1 positions shown; findings below may reference images not displayed]

FINDINGS: Changes of right hip replacement. Normal AP alignment. No hardware
or bony complicating feature.
IMPRESSION: Right hip replacement.  No complicating feature

## 2023-12-03 ENCOUNTER — Other Ambulatory Visit (HOSPITAL_COMMUNITY): Payer: Self-pay | Admitting: Otolaryngology

## 2023-12-03 ENCOUNTER — Telehealth: Payer: Self-pay | Admitting: Radiation Oncology

## 2023-12-03 ENCOUNTER — Ambulatory Visit (HOSPITAL_COMMUNITY)
Admission: RE | Admit: 2023-12-03 | Discharge: 2023-12-03 | Disposition: A | Discharge status: Home / Self Care | Payer: No Typology Code available for payment source | Source: Ambulatory Visit | Attending: Otolaryngology | Admitting: Otolaryngology

## 2023-12-03 DIAGNOSIS — R634 Abnormal weight loss: Secondary | ICD-10-CM

## 2023-12-03 DIAGNOSIS — D490 Neoplasm of unspecified behavior of digestive system: Secondary | ICD-10-CM | POA: Insufficient documentation

## 2023-12-03 DIAGNOSIS — C148 Malignant neoplasm of overlapping sites of lip, oral cavity and pharynx: Secondary | ICD-10-CM

## 2023-12-03 MED ORDER — IOHEXOL 300 MG/ML  SOLN
75.0000 mL | Freq: Once | INTRAMUSCULAR | Status: AC | PRN
  Administered 2023-12-03: 75 mL via INTRAVENOUS

## 2023-12-03 NOTE — Telephone Encounter (Signed)
 2/28 Received urgent referral.  Spoke to patient's wife.  Will need pathology report and images done before schedule patient.  She would like a call back on [phone], to schedule patient.

## 2023-12-07 ENCOUNTER — Telehealth: Payer: Self-pay | Admitting: Radiation Oncology

## 2023-12-07 NOTE — Telephone Encounter (Signed)
 3/4 Patient now scheduled for 3/19, sent appointment reminder with instructions in the mail to patient's residence as requested.

## 2023-12-08 NOTE — Progress Notes (Signed)
 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   Patient Signs and Symptoms: Patient experienced significant weight loss and dysphagia with solid foods.   Biopsies revealed:    Nutrition Status Yes No Comments  Weight changes? [x]  []  One month ago patient weighed 108 and now weighs 101. Patient does have difficulty swallowing soft foods but tries his best. Some foods patient eats is cream of potaotoes, green bean caserole, ice cream, etc.  Swallowing concerns? [x]  []  Patient has been complaining his throat hurt around December. Used Chloraseptic spray  PEG? []  [x]     Referrals Yes No Comments  Social Work? [x]  []    Dentistry? [x]  []    Swallowing therapy? [x]  []    Nutrition? [x]  []    Med/Onc? [x]  []     Safety Issues Yes No Comments  Prior radiation? []  [x]    Pacemaker/ICD? []  [x]    Possible current pregnancy? []  [x]    Is the patient on methotrexate? []  [x]     Tobacco/Marijuana/Snuff/ETOH use:  Patient reports he has been smoking cigarettes. He began smoking 60 years ago, has a 60 pack year history. History of heavy alcohol consumption, although he has abstained from alcohol for the past 6 months.  Past/Anticipated interventions by otolaryngology, if any:  [place], MD 12/09/2023 Flexible Fiberoptic Laryngoscopy  Incisional Biopsy of Left Tonsil   Past/Anticipated interventions by medical oncology, if any:  Radiation Therapy   BP 119/81 (BP Location: Right Arm, Patient Position: Sitting, Cuff Size: Small)   Pulse 69   Temp (!) 97.1 F (36.2 C)   Resp 20   Ht 5\' 11"  (1.803 m)   Wt 101 lb 12.8 oz (46.2 kg)   SpO2 96%   BMI 14.20 kg/m     Wt Readings from Last 3 Encounters:  12/22/23 101 lb 12.8 oz (46.2 kg)  12/10/23 110 lb (49.9 kg)     Current Complaints / other details:   Patient has been complaining of throat pain

## 2023-12-10 ENCOUNTER — Encounter (HOSPITAL_COMMUNITY)
Admission: RE | Admit: 2023-12-10 | Discharge: 2023-12-10 | Disposition: A | Discharge status: Home / Self Care | Payer: Medicare Other | Source: Ambulatory Visit | Attending: Otolaryngology | Admitting: Otolaryngology

## 2023-12-10 DIAGNOSIS — C148 Malignant neoplasm of overlapping sites of lip, oral cavity and pharynx: Secondary | ICD-10-CM | POA: Diagnosis present

## 2023-12-10 LAB — GLUCOSE, CAPILLARY: Glucose-Capillary: 111 mg/dL — ABNORMAL HIGH (ref 70–99)

## 2023-12-10 MED ORDER — FLUDEOXYGLUCOSE F - 18 (FDG) INJECTION
5.4900 | Freq: Once | INTRAVENOUS | Status: AC | PRN
  Administered 2023-12-10: 5.49 via INTRAVENOUS

## 2023-12-10 NOTE — Progress Notes (Signed)
 Oncology Nurse Navigator Documentation   Placed introductory call to new referral patient Julian Mejia. I spoke with his wife, [name]. Introduced myself as the H&N oncology nurse navigator that works with Dr. Basilio Cairo to whom he has been referred by Dr. Gerilyn Pilgrim. She confirmed understanding of referral. Briefly explained my role as his navigator, provided my contact information.  Confirmed understanding of upcoming appts and [facility] location, explained arrival and registration process. He is scheduled to see a dentist at [facility] on 3/12 for evaluation.   I encouraged them to call with questions/concerns as he moves forward with appts and procedures.   She verbalized understanding of information provided, expressed appreciation for my call.   Navigator Initial Assessment Employment Status: he is retired Currently on [employer] / STD: no Living Situation: he lives with his wife Support System: wife, family PCP: Carleene Cooper MD PCD: none Financial Concerns: na Transportation Needs: no Sensory Deficits: na Language Barriers/Interpreter Needed:  no Ambulation Needs: no Psychosocial Needs:  no Concerns/Needs Understanding Cancer:  addressed/answered by navigator to best of ability Self-Expressed Needs: no   Tourist information centre manager, BSN, OCN Head & Neck Oncology Nurse Navigator  [facility] Phone # [phone]  Fax # [phone]

## 2023-12-21 NOTE — Progress Notes (Signed)
 Radiation Oncology         [phone] ________________________________  Initial outpatient Consultation  Name: Julian Mejia MRN: [id number]  Date: 12/22/2023  DOB: 1950-12-30  YQ:IHKV, Halina Andreas, MD  Dorna Leitz, MD   REFERRING PHYSICIAN: Dorna Leitz, MD  DIAGNOSIS: No diagnosis found.  New stage iva left tonsil, post pharyngeal wall tumor  (clinical stage), Extensive    Cancer Staging  No matching staging information was found for the patient.   CHIEF COMPLAINT: Here to discuss management of cancer  HISTORY OF PRESENT ILLNESS::Julian Mejia is a 73 y.o. male who presented to Dr. Lucky Cowboy with complains of a sore throat and unintentional weight loss.   To further investigate his symptoms, he underwent a CT soft tissue neck on 12-03-23 showing an asymmetric and suspicious soft tissue along the left lateral wall of the pharynx, most pronounced at the level of the uvula and extending inferiorly to the left hypopharynx. Scan also indicated a a small right level 2A lymph node is abnormally heterogeneous, measuring 6-7 mm at the greatest extend and a slightly larger heterogeneous right level 2 B lymph node. CT chest done then demonstrated no  acute process or explanation for weight loss and no evidence of metastatic disease.    Subsequently, the patient was referred to Dr. Willeen Cass on 12-09-23 who preformed a flexible fiberoptic laryngoscopy showing a left pharyngeal lesion extending from the soft palate to the hypopharynx. The posterior soft palate, uvula, tongue base, epiglottis, aryepiglottic folds, hypopharynx, supraglottis, glottis and vallecula were visualized and appeared healthy without mucosal masses or lesions.   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   Pertinent imaging thus far includes PET scan performed on 12-10-23 revealing a hypermetabolic oropharyngeal mass measuring 1.3 x 1.7 cm with  involvement of the deep/lateral right fossa of Rosenmller, left nasopharyngeal soft tissues and bilateral level II cervical lymph nodes.  He was seen in the [facility] oncology clinic on 12-15-23 and by dentistry on 12-20-23.   Swallowing issues, if any: dysphagia with solid foods   Weight Changes: patient weight was around 120-130 in 2020, 112 last year, currently at 108 pounds. Approximately 30-40 lbs weight loss.     Pain status: ***  Other symptoms: poor appetite, SOB with any activity, sore throat.  Tobacco history, if any: smoking history of 60 years ago with a 120 pack/yearly. He continues to smoke 1/2 pk/day   ETOH abuse, if any: History of heavy alcohol consumption, although he has abstained from alcohol for the past 6 months   Prior cancers, if any: none   PREVIOUS RADIATION THERAPY: No  PAST MEDICAL HISTORY:  has a past medical history of COPD (chronic obstructive pulmonary disease) (HCC) and Hypertension.    PAST SURGICAL HISTORY: Past Surgical History:  Procedure Laterality Date   HERNIA REPAIR     REPAIR OF PERFORATED ULCER     TOTAL HIP ARTHROPLASTY Right 05/08/2016   Procedure: TOTAL HIP ARTHROPLASTY ANTERIOR APPROACH;  Surgeon: Tarry Kos, MD;  Location: [facility] OR;  Service: Orthopedics;  Laterality: Right;    FAMILY HISTORY: family history includes Cancer in his mother; Coronary artery disease in his father.  SOCIAL HISTORY:  reports that he has been smoking cigarettes. He has never used smokeless tobacco. He reports current alcohol use. He reports that he does not use drugs.  ALLERGIES: Patient has no known allergies.  MEDICATIONS:  Current Outpatient Medications  Medication Sig Dispense Refill  albuterol (PROVENTIL HFA;VENTOLIN HFA) 108 (90 Base) MCG/ACT inhaler Inhale 2 puffs into the lungs every 6 (six) hours as needed for wheezing or shortness of breath.      alendronate (FOSAMAX) 70 MG tablet Take 70 mg by mouth every Friday. Take with a full glass of water on an  empty stomach.      amLODipine (NORVASC) 5 MG tablet Take 10 mg by mouth daily.     cholecalciferol (VITAMIN D) 1000 units tablet Take 2,000 Units by mouth daily.      donepezil (ARICEPT) 5 MG tablet Take 10 mg by mouth daily.     enoxaparin (LOVENOX) 40 MG/0.4ML injection Inject 0.4 mLs (40 mg total) into the skin daily. 14 Syringe 0   losartan (COZAAR) 50 MG tablet Take 50 mg by mouth daily.     metoprolol (LOPRESSOR) 50 MG tablet Take 50 mg by mouth daily.     oxyCODONE (OXY IR/ROXICODONE) 5 MG immediate release tablet Take 1-3 tablets (5-15 mg total) by mouth every 4 (four) hours as needed. 90 tablet 0   tiotropium (SPIRIVA) 18 MCG inhalation capsule Place 18 mcg into inhaler and inhale daily.     No current facility-administered medications for this encounter.    REVIEW OF SYSTEMS:  Notable for that above.   PHYSICAL EXAM:  vitals were not taken for this visit.   General: Alert and oriented, in no acute distress HEENT: Head is normocephalic. Extraocular movements are intact. Oropharynx is notable for ***. Neck: Neck is notable for *** Heart: Regular in rate and rhythm with no murmurs, rubs, or gallops. Chest: Clear to auscultation bilaterally, with no rhonchi, wheezes, or rales. Abdomen: Soft, nontender, nondistended, with no rigidity or guarding. Extremities: No cyanosis or edema. Lymphatics: see Neck Exam Skin: No concerning lesions. Musculoskeletal: symmetric strength and muscle tone throughout. Neurologic: Cranial nerves II through XII are grossly intact. No obvious focalities. Speech is fluent. Coordination is intact. Psychiatric: Judgment and insight are intact. Affect is appropriate.   ECOG = ***  0 - Asymptomatic (Fully active, able to carry on all predisease activities without restriction)  1 - Symptomatic but completely ambulatory (Restricted in physically strenuous activity but ambulatory and able to carry out work of a light or sedentary nature. For example, light  housework, office work)  2 - Symptomatic, <50% in bed during the day (Ambulatory and capable of all self care but unable to carry out any work activities. Up and about more than 50% of waking hours)  3 - Symptomatic, >50% in bed, but not bedbound (Capable of only limited self-care, confined to bed or chair 50% or more of waking hours)  4 - Bedbound (Completely disabled. Cannot carry on any self-care. Totally confined to bed or chair)  5 - Death   Santiago Glad MM, Creech RH, Tormey DC, et al. [phone]). "Toxicity and response criteria of the [facility]". Am. Evlyn Clines. Oncol. 5 (6): 649-55   LABORATORY DATA:  Lab Results  Component Value Date   WBC 9.4 05/11/2016   HGB 10.0 (L) 05/11/2016   HCT 29.8 (L) 05/11/2016   MCV 95.2 05/11/2016   PLT 192 05/11/2016   CMP     Component Value Date/Time   NA 132 (L) 05/10/2016 0551   K 3.3 (L) 05/10/2016 0551   CL 97 (L) 05/10/2016 0551   CO2 27 05/10/2016 0551   GLUCOSE 108 (H) 05/10/2016 0551   BUN 6 05/10/2016 0551   CREATININE 0.57 (L) 05/10/2016 5621  CALCIUM 8.4 (L) 05/10/2016 0551   GFRNONAA >60 05/10/2016 0551   GFRAA >60 05/10/2016 0551      No results found for: "TSH"   RADIOGRAPHY: NM PET Image Initial (PI) Skull Base To Thigh (F-18 FDG) Result Date: 12/13/2023 CLINICAL DATA:  Initial treatment strategy for head neck cancer. EXAM: NUCLEAR MEDICINE PET SKULL BASE TO THIGH TECHNIQUE: 5.5 mCi F-18 FDG was injected intravenously. Full-ring PET imaging was performed from the skull base to thigh after the radiotracer. CT data was obtained and used for attenuation correction and anatomic localization. Fasting blood glucose: 111 mg/dl COMPARISON:  CT neck and chest 12/03/2023. FINDINGS: Mediastinal blood pool activity: SUV max 2.1 Liver activity: SUV max NA NECK: Hypermetabolic left nasopharyngeal hypermetabolism, SUV max 11.3, corresponding to mildly asymmetric soft tissue, measuring 1.3 x 1.7 cm. Focal hypermetabolism in  the deep/lateral right fossa of Rosenmller, SUV max 9.6. Correlation with CT is somewhat challenging without IV contrast. There does appear to be asymmetric soft tissue in this region, however. Posterior and left lateral oropharyngeal hypermetabolism, SUV max 16.6. Again, correlation with CT is challenging without IV contrast but mass measures approximately 2.0 x 5.0 cm (4/24). Hypermetabolic bilateral level II lymph nodes with index lymph node on the right measuring approximately 8 mm (4/20), SUV max 4.7. Incidental CT findings: None. CHEST: No abnormal hypermetabolism. Incidental CT findings: Atherosclerotic calcification of the aorta, aortic valve and coronary arteries. Heart size normal. No pericardial effusion. No pleural effusion. Centrilobular and paraseptal emphysema. ABDOMEN/PELVIS: No abnormal hypermetabolism. Incidental CT findings: Small low-attenuation lesion off the right kidney. No specific follow-up necessary. SKELETON: No abnormal hypermetabolism. Incidental CT findings: Right hip arthroplasty. Osteopenia. Degenerative changes in the spine. IMPRESSION: 1. Hypermetabolic oropharyngeal mass with involvement of the deep/lateral right fossa of Rosenmller, left nasopharyngeal soft tissues and bilateral level II cervical lymph nodes. 2. Aortic atherosclerosis (ICD10-I70.0). Coronary artery calcification. 3.  Emphysema (ICD10-J43.9). Electronically Signed   By: Leanna Battles M.D.   On: 12/13/2023 09:42   CT CHEST W CONTRAST Result Date: 12/03/2023 CLINICAL DATA:  "Tonsillar tumor" weight loss. * Tracking Code: BO * EXAM: CT CHEST WITH CONTRAST TECHNIQUE: Multidetector CT imaging of the chest was performed during intravenous contrast administration. RADIATION DOSE REDUCTION: This exam was performed according to the departmental dose-optimization program which includes automated exposure control, adjustment of the mA and/or kV according to patient size and/or use of iterative reconstruction technique.  CONTRAST:  75mL OMNIPAQUE IOHEXOL 300 MG/ML  SOLN COMPARISON:  05/07/2016 chest radiograph.  No prior CT. FINDINGS: Cardiovascular: Aortic atherosclerosis. Tortuous thoracic aorta. Normal heart size, without pericardial effusion. Lad and right coronary artery calcification. No central pulmonary embolism, on this non-dedicated study. Mediastinum/Nodes: No supraclavicular adenopathy. No mediastinal or hilar adenopathy. Lungs/Pleura: No pleural fluid.  Advanced bullous emphysema. Secretions in the dependent trachea. Biapical pleuroparenchymal scarring. No suspicious pulmonary nodule or mass. Upper Abdomen: Minimal anterior left hepatic lobe hyperenhancement on 62/3 of approximately 4 mm, most likely a perfusion anomaly. Normal imaged portions of the spleen, stomach, adrenal glands, kidneys. Musculoskeletal: Osteopenia. Subtle eighth posterolateral right rib sclerosis may relate to remote trauma. IMPRESSION: 1. No acute process or explanation for weight loss. No evidence of metastatic disease. 2. Advanced bullous emphysema.   (ICD10-J43.9). 3. Coronary artery atherosclerosis. Aortic Atherosclerosis (ICD10-I70.0). Electronically Signed   By: Jeronimo Greaves M.D.   On: 12/03/2023 16:13   CT SOFT TISSUE NECK W CONTRAST Result Date: 12/03/2023 CLINICAL DATA:  73 year old male "tonsillar tumor, weight loss". EXAM: CT NECK WITH CONTRAST  TECHNIQUE: Multidetector CT imaging of the neck was performed using the standard protocol following the bolus administration of intravenous contrast. RADIATION DOSE REDUCTION: This exam was performed according to the departmental dose-optimization program which includes automated exposure control, adjustment of the mA and/or kV according to patient size and/or use of iterative reconstruction technique. CONTRAST:  75mL OMNIPAQUE IOHEXOL 300 MG/ML  SOLN COMPARISON:  None Available. FINDINGS: Pharynx and larynx: Pharynx and larynx motion artifact. Larynx soft tissue contours appear fairly  symmetric, epiglottis remains within normal limits. No convincing laryngeal tumor. Asymmetric and suspicious soft tissue along the left lateral wall of the pharynx, although ill-defined. This appears most pronounced at the level of the uvula on series 2, image 38. The abnormal soft tissue thickening might extend inferiorly to the left hypopharynx. Nasopharynx contour appears symmetric and within normal limits. Parapharyngeal and retropharyngeal spaces seem to remain normal. Salivary glands: Negative.  Negative sublingual space. Thyroid: Negative. Lymph nodes: No abnormally enlarged cervical lymph nodes. However, a small right level 2A lymph node is abnormally heterogeneous, measures 6-7 mm on series 2, image 52. Furthermore, slightly larger (8 mm short axis), heterogeneous right level 2 B lymph node (coronal image 74 and series 2, image 38). Contralateral left side level 2 nodes measure up to 7 mm short axis and appear homogeneous. Vascular: Major vascular structures in the neck and at the skull base remain patent. Bulky calcified right ICA origin atherosclerosis. Limited intracranial: Negative. Visualized orbits: Negative. Mastoids and visualized paranasal sinuses: Clear bilaterally. Skeleton: Absent maxillary dentition. Osteopenia. No acute or suspicious osseous lesion identified in the neck. Upper chest: Severe emphysema in the visible upper lungs. Chest CT today is reported separately. IMPRESSION: 1. Evaluation of the pharynx and larynx limited by motion artifact. 2. But the appearance is suspicious for oropharyngeal tumor, indistinct and most apparent along the left tonsillar pillar. 3. However, Contralateral small but malignant appearing Right level 2 lymph nodes (heterogeneous, 7-8 mm short axis). Left side cervical nodes are normal by imaging criteria. 4. Chest CT reported separately.  Emphysema (ICD10-J43.9). Electronically Signed   By: Odessa Fleming M.D.   On: 12/03/2023 15:54      IMPRESSION/PLAN:  This is  a delightful patient with head and neck cancer. I *** recommend radiotherapy for this patient.  We discussed the potential risks, benefits, and side effects of radiotherapy. We talked in detail about acute and late effects. We discussed that some of the most bothersome acute effects may be mucositis, dysgeusia, salivary changes, skin irritation, hair loss, dehydration, weight loss and fatigue. We talked about late effects which include but are not necessarily limited to dysphagia, hypothyroidism, nerve injury, vascular injury, spinal cord injury, xerostomia, trismus, neck edema, dental issues, non-healing wound, and potentially fatal injury to any of the tissues in the head and neck region. No guarantees of treatment were given. A consent form was signed and placed in the patient's medical record. The patient is enthusiastic about proceeding with treatment. I look forward to participating in the patient's care.    Simulation (treatment planning) will take place ***  We also discussed that the treatment of head and neck cancer is a multidisciplinary process to maximize treatment outcomes and quality of life. For this reason the following referrals have been or will be made:  *** Medical oncology to discuss chemotherapy   *** Dentistry for dental evaluation, possible extractions in the radiation fields, and /or advice on reducing risk of cavities, osteoradionecrosis, or other oral issues.  *** Nutritionist for  nutrition support during and after treatment.  *** Speech language pathology for swallowing and/or speech therapy.  *** Social work for social support.   *** Physical therapy due to risk of lymphedema in neck and deconditioning.  *** Baseline labs including TSH.  On date of service, in total, I spent *** minutes on this encounter. Patient was seen in person.  __________________________________________   Lonie Peak, MD  This document serves as a record of services personally  performed by Lonie Peak, MD. It was created on her behalf by Herbie Saxon, a trained medical scribe. The creation of this record is based on the scribe's personal observations and the provider's statements to them. This document has been checked and approved by the attending provider.

## 2023-12-22 ENCOUNTER — Ambulatory Visit
Admission: RE | Admit: 2023-12-22 | Discharge: 2023-12-22 | Disposition: A | Discharge status: Home / Self Care | Source: Ambulatory Visit | Attending: Radiation Oncology | Admitting: Radiation Oncology

## 2023-12-22 ENCOUNTER — Encounter: Payer: Self-pay | Admitting: Radiation Oncology

## 2023-12-22 ENCOUNTER — Ambulatory Visit: Admitting: Radiation Oncology

## 2023-12-22 VITALS — BP 119/81 | HR 69 | Temp 97.1°F | Resp 20 | Ht 71.0 in | Wt 101.8 lb

## 2023-12-22 DIAGNOSIS — C109 Malignant neoplasm of oropharynx, unspecified: Secondary | ICD-10-CM | POA: Insufficient documentation

## 2023-12-22 DIAGNOSIS — C140 Malignant neoplasm of pharynx, unspecified: Secondary | ICD-10-CM | POA: Insufficient documentation

## 2023-12-22 NOTE — Progress Notes (Signed)
 Oncology Nurse Navigator Documentation   Met with patient during initial consult with Julian Mejia. He was accompanied by his wife, [name]. Further introduced myself as his/their Navigator, explained my role as a member of the Care Team. Provided New Patient resource guide binder: Contact information for physicians, this navigator, other members of the Care Team Advance Directive information; provided [facility] AD booklet at their request,  Fall Prevention Patient Safety Plan Financial Assistance Information sheet Symptom Management Clinic information [facility] map with highlight of [facility] SLP Information sheet Head and Neck cancer basics Nutrition information Patient and family support information including Spiritual care/Chaplain information, Peer mentor program, health and wellness classes, and the survivorship program Community resources  Provided and discussed educational handouts for PEG and PAC. Assisted with post-consult appt scheduling. He will have lower mouth teeth extractions on Monday 3/24 at [facility]. He currently wears dentures to his upper mouth.    [facility] to see if there is a date/time scheduled.  They verbalized understanding of information provided. I encouraged them to call with questions/concerns moving forward.  Hedda Slade, RN, BSN, OCN Head & Neck Oncology Nurse Navigator [facility] [phone]

## 2023-12-24 ENCOUNTER — Other Ambulatory Visit: Payer: Self-pay

## 2023-12-24 DIAGNOSIS — C148 Malignant neoplasm of overlapping sites of lip, oral cavity and pharynx: Secondary | ICD-10-CM

## 2023-12-24 DIAGNOSIS — C109 Malignant neoplasm of oropharynx, unspecified: Secondary | ICD-10-CM

## 2023-12-28 ENCOUNTER — Telehealth: Payer: Self-pay

## 2023-12-28 NOTE — Telephone Encounter (Signed)
 [facility] Clinical Social Work  Clinical Social Work was referred by new patient protocol for assessment of psychosocial needs.  Clinical Social Worker contacted caregiver by phone to offer support and assess for needs. Patient and Spouse were driving to [facility] for feeding tube placement. CSW will attempt to reach patient and spouse on 3/28   Marguerita Merles, LCSW  Clinical Social Worker [facility]

## 2023-12-29 ENCOUNTER — Ambulatory Visit

## 2023-12-29 ENCOUNTER — Ambulatory Visit: Admitting: Radiation Oncology

## 2023-12-29 DIAGNOSIS — C140 Malignant neoplasm of pharynx, unspecified: Secondary | ICD-10-CM

## 2023-12-30 ENCOUNTER — Telehealth: Payer: Self-pay | Admitting: Dietician

## 2023-12-30 ENCOUNTER — Inpatient Hospital Stay: Attending: Radiation Oncology | Admitting: Dietician

## 2023-12-30 NOTE — Telephone Encounter (Signed)
 Nutrition Assessment   Reason for Assessment: New HNC   ASSESSMENT: 73 year old male with stage IV SCC of left tonsil. Planning 4-7 weeks of radiotherapy under the care of Dr. Basilio Cairo (SIM planned 4/2).   Past medical history includes periodontal disease s/p extractions of compromised teeth 3/24 ([facility]), PCM, osteoporosis, alcohol dependence (abstained x42m), COPD, GERD, HTN, tobacco use, vit D deficiency  S/p Gtube 3/25 ([facility])  Spoke with wife via telephone. She reports pt is quite sore today. She has provided PEG care and water flush. Pt is tolerating soft diet (pudding, fish, salmon patties, egg salad, ice cream, soft cooked vegetables) Had pork brains, eggs, and grits this morning. Pt drinking coffee, tea and little bit of soda. Carbonation is bothersome, pt has significantly reduced soda intake. Wife reports they will be going through the [place] and have appointment on 4/2 after SIM. Wife unsure if [place] RD will be managing tube feedings. [facility] will [facility] to discuss [facility].    Nutrition Focused Physical Exam: unable to complete - telephone    Medications: fosomax, amlodipine, vit D, aricept, lovenox, cozaar, lopressor, roxidcodone   Labs: No recent labs for review   Anthropometrics:   Height: 5'11" Weight: 101 lb 12.8 oz (3/19) UBW: 120-130 lb (2020 per Dr. Basilio Cairo consult note) BMI: 14.2 (underwt)   NUTRITION DIAGNOSIS: Unintended wt loss related to tonsil cancer as evidenced by dysphagia/odynophagia, multiple teeth extractions, s/p Gtube, ~19% decrease from reported usual wt   MALNUTRITION DIAGNOSIS: Given current wt and history, suspect pt to meet criteria for severe malnutrition. Unable to perform NFPE at this time to assess degree of fat/muscle depletion.    INTERVENTION:  Educated to continue daily FWF + PEG care Wife to contact RD with nutrition plan after discussing with [facility] provided shake recipes, soft moist high protein foods list along with recipes via  email Contact information provided    MONITORING, EVALUATION, GOAL: Pt will tolerate increased calories and protein to promote wt gain   Next Visit: To be determined. Treatment plan under workup

## 2024-01-03 ENCOUNTER — Inpatient Hospital Stay

## 2024-01-03 NOTE — Progress Notes (Signed)
 [facility] Clinical Social Work  Initial Assessment   Daiquan Resnik is a 73 y.o. year old male contacted by phone. Clinical Social Work was referred by nurse navigator for assessment of psychosocial needs.   SDOH (Social Determinants of Health) assessments performed: No   SDOH Screenings   Food Insecurity: No Food Insecurity (12/22/2023)  Housing: Low Risk  (12/22/2023)  Transportation Needs: No Transportation Needs (12/22/2023)  Utilities: Not At Risk (12/22/2023)  Depression (PHQ2-9): Low Risk  (12/22/2023)  Tobacco Use: High Risk (12/22/2023)     Distress Screen completed: No     No data to display            Family/Social Information:  Housing Arrangement: patient lives with spouse. Patient and spouse have three children (one across the street, one approx eight miles down the road,and one in [place])  Family members/support persons in your life? Family Transportation concerns: no, patient's spouse feels comfortable with transporting husband to and from treatment.   Employment: Retired .  Income source: Actor concerns:  patient and spouse are comfortable. However, medical bills could impact financial status. Type of concern: Medical bills Food access concerns: no Religious or spiritual practice:  Pam Drown Advanced directives:  No. Patient is interested in completing a Living Will and DNR. Patient will explore with Oncologist  at the [place]. Services Currently in place:  Insurance, Family, Transportation  Coping/ Adjustment to diagnosis: Patient understands treatment plan and what happens next? yes Concerns about diagnosis and/or treatment: I'm not especially worried about anything Patient reported stressors:  Patient has not reported any concerns Hopes and/or priorities: For treatment to be successful Patient enjoys watching TV Current coping skills/ strengths: Supportive family/friends     SUMMARY: Current SDOH Barriers:  No SDOH barriers  identified at this time.  Clinical Social Work Clinical Goal(s):  No clinical social work goals at this time  Interventions: Discussed common feeling and emotions when being diagnosed with cancer, and the importance of support during treatment Informed patient of the support team roles and support services at [facility] Provided CSW contact information and encouraged patient to call with any questions or concerns Provided education and assistance to patient regarding Advance Directives   Follow Up Plan: CSW will follow-up with patient by phone  Patient verbalizes understanding of plan: Yes  Marguerita Merles, LCSW Clinical Social Worker [facility]

## 2024-01-04 NOTE — Progress Notes (Incomplete)
 Has armband been applied?  {yes no:314532}  Does patient have an allergy to IV contrast dye?: {yes no:314532}   Has patient ever received premedication for IV contrast dye?: {yes no:314532}   Date of lab work: December 16, 2023 BUN: 18 CR: 0.93 eGFR: 86  Does patient take metformin?: {yes no:314532}  Is eGFR >60?: Yes.   If no, when can patient resume? (Must be 48 hrs AFTER they receive IV contrast):  {Time; dates multiple:15870}  IV site: {iv locations:314275}  Has IV site been added to flowsheet?  {yes no:314532}  There were no vitals taken for this visit.

## 2024-01-05 ENCOUNTER — Ambulatory Visit
Admission: RE | Admit: 2024-01-05 | Discharge: 2024-01-05 | Disposition: A | Discharge status: Home / Self Care | Source: Ambulatory Visit | Attending: Radiation Oncology | Admitting: Radiation Oncology

## 2024-01-05 VITALS — BP 117/77 | HR 74 | Temp 97.5°F | Resp 18 | Ht 71.0 in | Wt 109.1 lb

## 2024-01-05 DIAGNOSIS — C140 Malignant neoplasm of pharynx, unspecified: Secondary | ICD-10-CM

## 2024-01-05 NOTE — Progress Notes (Signed)
 Oncology Nurse Navigator Documentation   To provide support, encouragement and care continuity, met with Julian Mejia and his wife before his CT SIM.  He tolerated procedure without difficulty, denied questions/concerns.   I encouraged him to call me prior to 01/10/24 [place].   Hedda Slade RN, BSN, OCN Head & Neck Oncology Nurse Navigator [facility] Phone # [phone]  Fax # [phone]

## 2024-01-05 NOTE — Progress Notes (Signed)
 Has armband been applied?  Yes.    Does patient have an allergy to IV contrast dye?: No.   Has patient ever received premedication for IV contrast dye?: No.   Date of lab work: December 16, 2023 BUN: 18 CR: 0.93 eGFR: 86  Does patient take metformin?: No.  Is eGFR >60?: Yes.      Left AC patent and no redness  Has IV site been added to flowsheet?  Yes.     BP 117/77 (BP Location: Left Arm, Patient Position: Sitting)   Pulse 74   Temp (!) 97.5 F (36.4 C) (Temporal)   Resp 18   Ht 5\' 11"  (1.803 m)   Wt 109 lb 2 oz (49.5 kg)   SpO2 99%   BMI 15.22 kg/m

## 2024-01-07 DIAGNOSIS — C140 Malignant neoplasm of pharynx, unspecified: Secondary | ICD-10-CM | POA: Diagnosis not present

## 2024-01-10 ENCOUNTER — Other Ambulatory Visit: Payer: Self-pay

## 2024-01-10 ENCOUNTER — Ambulatory Visit
Admission: RE | Admit: 2024-01-10 | Discharge: 2024-01-10 | Disposition: A | Discharge status: Home / Self Care | Source: Ambulatory Visit | Attending: Radiation Oncology | Admitting: Radiation Oncology

## 2024-01-10 DIAGNOSIS — C140 Malignant neoplasm of pharynx, unspecified: Secondary | ICD-10-CM | POA: Diagnosis not present

## 2024-01-10 LAB — RAD ONC ARIA SESSION SUMMARY
Course Elapsed Days: 0
Plan Fractions Treated to Date: 1
Plan Prescribed Dose Per Fraction: 2 Gy
Plan Total Fractions Prescribed: 35
Plan Total Prescribed Dose: 70 Gy
Reference Point Dosage Given to Date: 2 Gy
Reference Point Session Dosage Given: 2 Gy
Session Number: 1

## 2024-01-10 NOTE — Progress Notes (Signed)
 Oncology Nurse Navigator Documentation   To provide support, encouragement and care continuity, met with Mr. Julian Mejia after his initial RT.  He was accompanied by his wife. I reviewed the 2-step treatment process, answered questions.  Mr. Sabol completed treatment without difficulty, denied questions/concerns. I reviewed the registration/arrival procedure for subsequent treatments. We discussed his upcoming appointments.  I encouraged them to call me with questions/concerns as treatments proceed.   Hedda Slade RN, BSN, OCN Head & Neck Oncology Nurse Navigator [facility] at [facility] Phone # [phone]  Fax # [phone]

## 2024-01-11 ENCOUNTER — Other Ambulatory Visit: Payer: Self-pay

## 2024-01-11 ENCOUNTER — Ambulatory Visit
Admission: RE | Admit: 2024-01-11 | Discharge: 2024-01-11 | Disposition: A | Discharge status: Home / Self Care | Source: Ambulatory Visit | Attending: Radiation Oncology | Admitting: Radiation Oncology

## 2024-01-11 DIAGNOSIS — C140 Malignant neoplasm of pharynx, unspecified: Secondary | ICD-10-CM | POA: Diagnosis not present

## 2024-01-11 LAB — RAD ONC ARIA SESSION SUMMARY
Course Elapsed Days: 1
Plan Fractions Treated to Date: 2
Plan Prescribed Dose Per Fraction: 2 Gy
Plan Total Fractions Prescribed: 35
Plan Total Prescribed Dose: 70 Gy
Reference Point Dosage Given to Date: 4 Gy
Reference Point Session Dosage Given: 2 Gy
Session Number: 2

## 2024-01-12 ENCOUNTER — Inpatient Hospital Stay

## 2024-01-12 ENCOUNTER — Encounter: Payer: Self-pay | Admitting: Oncology

## 2024-01-12 ENCOUNTER — Ambulatory Visit
Admission: RE | Admit: 2024-01-12 | Discharge: 2024-01-12 | Disposition: A | Discharge status: Home / Self Care | Source: Ambulatory Visit | Attending: Radiation Oncology | Admitting: Radiation Oncology

## 2024-01-12 ENCOUNTER — Inpatient Hospital Stay: Attending: Radiation Oncology | Admitting: Oncology

## 2024-01-12 ENCOUNTER — Other Ambulatory Visit: Payer: Self-pay

## 2024-01-12 VITALS — BP 124/82 | HR 70 | Temp 98.3°F | Resp 16 | Wt 107.4 lb

## 2024-01-12 DIAGNOSIS — C109 Malignant neoplasm of oropharynx, unspecified: Secondary | ICD-10-CM | POA: Diagnosis present

## 2024-01-12 DIAGNOSIS — Z79899 Other long term (current) drug therapy: Secondary | ICD-10-CM | POA: Insufficient documentation

## 2024-01-12 DIAGNOSIS — I10 Essential (primary) hypertension: Secondary | ICD-10-CM | POA: Insufficient documentation

## 2024-01-12 DIAGNOSIS — Z8249 Family history of ischemic heart disease and other diseases of the circulatory system: Secondary | ICD-10-CM | POA: Insufficient documentation

## 2024-01-12 DIAGNOSIS — J439 Emphysema, unspecified: Secondary | ICD-10-CM | POA: Insufficient documentation

## 2024-01-12 DIAGNOSIS — Z809 Family history of malignant neoplasm, unspecified: Secondary | ICD-10-CM | POA: Diagnosis not present

## 2024-01-12 DIAGNOSIS — C140 Malignant neoplasm of pharynx, unspecified: Secondary | ICD-10-CM

## 2024-01-12 DIAGNOSIS — Z87891 Personal history of nicotine dependence: Secondary | ICD-10-CM | POA: Insufficient documentation

## 2024-01-12 DIAGNOSIS — R1310 Dysphagia, unspecified: Secondary | ICD-10-CM | POA: Insufficient documentation

## 2024-01-12 DIAGNOSIS — G893 Neoplasm related pain (acute) (chronic): Secondary | ICD-10-CM | POA: Insufficient documentation

## 2024-01-12 DIAGNOSIS — I700 Atherosclerosis of aorta: Secondary | ICD-10-CM | POA: Insufficient documentation

## 2024-01-12 DIAGNOSIS — I2510 Atherosclerotic heart disease of native coronary artery without angina pectoris: Secondary | ICD-10-CM | POA: Insufficient documentation

## 2024-01-12 DIAGNOSIS — E639 Nutritional deficiency, unspecified: Secondary | ICD-10-CM

## 2024-01-12 LAB — RAD ONC ARIA SESSION SUMMARY
Course Elapsed Days: 2
Plan Fractions Treated to Date: 3
Plan Prescribed Dose Per Fraction: 2 Gy
Plan Total Fractions Prescribed: 35
Plan Total Prescribed Dose: 70 Gy
Reference Point Dosage Given to Date: 6 Gy
Reference Point Session Dosage Given: 2 Gy
Session Number: 3

## 2024-01-12 LAB — CBC WITH DIFFERENTIAL (CANCER CENTER ONLY)
Abs Immature Granulocytes: 0.03 10*3/uL (ref 0.00–0.07)
Basophils Absolute: 0 10*3/uL (ref 0.0–0.1)
Basophils Relative: 0 %
Eosinophils Absolute: 0 10*3/uL (ref 0.0–0.5)
Eosinophils Relative: 0 %
HCT: 31.7 % — ABNORMAL LOW (ref 39.0–52.0)
Hemoglobin: 10.7 g/dL — ABNORMAL LOW (ref 13.0–17.0)
Immature Granulocytes: 1 %
Lymphocytes Relative: 26 %
Lymphs Abs: 1.4 10*3/uL (ref 0.7–4.0)
MCH: 31 pg (ref 26.0–34.0)
MCHC: 33.8 g/dL (ref 30.0–36.0)
MCV: 91.9 fL (ref 80.0–100.0)
Monocytes Absolute: 0.5 10*3/uL (ref 0.1–1.0)
Monocytes Relative: 9 %
Neutro Abs: 3.3 10*3/uL (ref 1.7–7.7)
Neutrophils Relative %: 64 %
Platelet Count: 274 10*3/uL (ref 150–400)
RBC: 3.45 MIL/uL — ABNORMAL LOW (ref 4.22–5.81)
RDW: 14.1 % (ref 11.5–15.5)
WBC Count: 5.2 10*3/uL (ref 4.0–10.5)
nRBC: 0 % (ref 0.0–0.2)

## 2024-01-12 LAB — CMP (CANCER CENTER ONLY)
ALT: 11 U/L (ref 0–44)
AST: 18 U/L (ref 15–41)
Albumin: 3.7 g/dL (ref 3.5–5.0)
Alkaline Phosphatase: 78 U/L (ref 38–126)
Anion gap: 6 (ref 5–15)
BUN: 14 mg/dL (ref 8–23)
CO2: 34 mmol/L — ABNORMAL HIGH (ref 22–32)
Calcium: 9.4 mg/dL (ref 8.9–10.3)
Chloride: 95 mmol/L — ABNORMAL LOW (ref 98–111)
Creatinine: 0.72 mg/dL (ref 0.61–1.24)
GFR, Estimated: >60 mL/min (ref >60)
Glucose, Bld: 103 mg/dL — ABNORMAL HIGH (ref 70–99)
Potassium: 4.3 mmol/L (ref 3.5–5.1)
Sodium: 135 mmol/L (ref 135–145)
Total Bilirubin: 0.4 mg/dL (ref 0.0–1.2)
Total Protein: 6.7 g/dL (ref 6.5–8.1)

## 2024-01-12 LAB — MAGNESIUM: Magnesium: 2.4 mg/dL (ref 1.7–2.4)

## 2024-01-12 LAB — TSH: TSH: 1.775 u[IU]/mL (ref 0.350–4.500)

## 2024-01-12 NOTE — Progress Notes (Signed)
 Oncology Nurse Navigator Documentation   Met with patient during initial consult with Dr. Arlana Pouch. He was accompanied by his wife.  Further introduced myself as his/their Navigator, explained my role as a member of the Care Team. Assisted with post-consult appt scheduling. He has been scheduled for chemotherapy education and will see Dr. Arlana Pouch in 2 weeks to make a final decision on chemotherapy.  They verbalized understanding of information provided. I encouraged them to call with questions/concerns moving forward.  Hedda Slade, RN, BSN, OCN Head & Neck Oncology Nurse Navigator [facility] at [place] [phone]

## 2024-01-12 NOTE — Progress Notes (Unsigned)
 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 gentleman with a past medical history of tobacco abuse, COPD, past alcohol abuse, hypertension, was referred to our clinic for recently diagnosed squamous cell carcinoma of the left tonsil, high risk HPV negative, stage IV B.   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   Discussed staging, plan of care, treatment options, prognosis and reviewed NCCN guidelines.  Surgery is not feasible. Radiation therapy is ongoing, started on 01/10/2024.    For his stage of disease, generally concurrent chemoradiation is recommended.  Chemotherapy is considered as an adjunct to enhance radiation efficacy but is not currently recommended due to limited functional status (ECOG PS 3) and potential risks outweighing benefits. Cisplatin, carboplatin, and Taxol are options, with cisplatin having significant side effects including nephrotoxicity and ototoxicity.   Radiation alone is viable given his condition. Goal is remission with radiation; chemotherapy would serve as a radiation sensitizer.   Decision to initiate chemotherapy depends on clinical improvement, particularly in nutrition and strength.  - Continue radiation therapy for seven weeks.  - Arrange chemotherapy education session to discuss  potential side effects and management.  - Focus on improving nutrition and hydration to enhance strength and tolerance for potential future chemotherapy.  Creatinine 0.72.  Hemoglobin 10.7, otherwise unremarkable CBCD.  We will reevaluate him in the next 2 weeks to see if we can attempt reduced dose chemotherapy with carboplatin (AUC 1.5) and paclitaxel (25 mg/m) weekly during the course of radiation.  Tentatively we will schedule this around 01/26/2024.  Carboplatin and paclitaxel was chosen over cisplatin because of better side effect profile given his performance status.  We will not pursue Port-A-Cath placement and attempt chemo via peripheral IV.  Inadequate nutrition Significant weight loss managed with feeding tube, leading to improved alertness and appetite. Hydration is crucial during radiation therapy. - Continue feeding tube use to maintain and improve nutritional status. - Monitor weight and nutritional intake.  Chronic obstructive pulmonary disease (COPD) (HCC) Reduced lung capacity due to smoking history, causing dyspnea and limiting physical activity. Smoking cessation is encouraged for lung health.  He recently quit smoking as per his wife. - Monitor respiratory status and manage symptoms as needed.   I reviewed lab results and outside records for this visit and discussed relevant results with the patient. Diagnosis, plan of care and treatment options were also discussed in detail with the patient. Opportunity provided to ask questions and answers provided to his apparent satisfaction. Provided instructions to call our clinic with any problems, questions or concerns prior to return visit. I recommended to continue follow-up with PCP and sub-specialists. He verbalized understanding and agreed with the plan. No barriers to learning was detected.  NCCN guidelines have been consulted in the planning of this patient's care.  Meryl Crutch, MD  01/12/2024 9:52 AM  [place]  [facility]  [facility] - A DEPT OF [facility] [address] [place] [address] Dept: [phone] Dept Fax: [phone]   HISTORY OF PRESENTING ILLNESS:   Discussed the use of AI scribe software for clinical note transcription with the patient, who gave verbal consent to proceed.   I have reviewed his chart and materials related to his cancer extensively and collaborated history with the patient. Summary of oncologic history is as follows:  ONCOLOGY HISTORY:  He presented to Dr. Lucky Cowboy with complains of a sore throat and unintentional weight loss.    To further investigate his symptoms, he underwent a CT soft tissue neck on 12/03/2023, which showed an asymmetric and suspicious soft tissue along the left lateral wall of the pharynx, most pronounced at the level of the uvula and extending inferiorly to the left hypopharynx. Scan also indicated a a small right level 2A lymph node which was abnormally heterogeneous, measuring 6-7 mm at the greatest extent and a slightly larger heterogeneous right level 2 B lymph node. CT chest at a later date demonstrated no acute process or explanation for weight loss and no evidence of metastatic disease.     Subsequently, the patient was referred to Dr. Willeen Cass on 12/09/2023 who preformed a flexible fiberoptic laryngoscopy showing a left pharyngeal lesion extending from the soft palate to the hypopharynx. The posterior soft palate, uvula, tongue base, epiglottis, aryepiglottic folds, hypopharynx, supraglottis, glottis and vallecula were visualized and appeared healthy without mucosal masses or lesions.    Biopsy of left tonsil on 12/09/2023 revealed: moderately differentiated keratinizing squamous cell carcinoma. Tissue stained high risk HPV negative, p16 positive, and p40 positive.    Staging PET scan on 12/10/2023 revealed a hypermetabolic oropharyngeal mass measuring 1.3 x 1.7 cm with involvement of the  deep/lateral right fossa of Rosenmuller, left nasopharyngeal soft tissues and bilateral level II cervical lymph nodes.   cT4b,cN2c,cM0,p16-, Stage IV B disease.   He underwent dental extractions on 12/27/2023.    Swallowing issues, if any: dysphagia with solid foods.    Weight Changes: patient weight was around 120-130 in 2020, 112 last year, currently at 108 pounds. Approximately 30-40 lbs weight loss.       Tobacco history, if any: smoking history of 60 years with 120-pack-year smoking history. He recently quit smoking as per his wife.  ETOH abuse, if any: History of heavy alcohol consumption, although he has abstained from alcohol for the past 6 months    He started radiation treatments from 01/10/2024.  His performance status is ECOG 3, making him a poor candidate for concurrent chemoradiation.  However per his wife, his weight and activity level has been slowly improving.  She she would like for him to receive concurrent modality treatment, if the benefits outweigh the risks.  For now he was advised to continue radiation treatments alone.  We will reevaluate him in the next 2 weeks to see if we can attempt reduced dose chemotherapy with carboplatin (AUC 1.5) and paclitaxel (25 mg/m) weekly during the course of radiation.  Tentatively we will schedule this around 01/26/2024.  Carboplatin and paclitaxel was chosen over cisplatin because of better side effect profile given his performance status.   Oncology History  Oropharyngeal cancer (HCC)  12/22/2023 Initial Diagnosis   Oropharyngeal cancer (HCC)   12/22/2023 Cancer Staging   Staging form: Pharynx - P16 Negative Oropharynx, AJCC 8th Edition - Clinical stage from 12/22/2023: Stage IVB (cT4b, cN2c, cM0, p16-) - Signed by Lonie Peak,  MD on 12/22/2023     INTERVAL HISTORY:  He is accompanied by his wife, who is very supportive and greatly involved in his care.  He has been diagnosed with stage IV squamous cell carcinoma of the head  and neck, with lymph node involvement on both sides. Radiation therapy began on Monday and is proceeding without significant discomfort. Surgery was not considered due to the bilateral lymph node involvement.  He experiences cancer-related pain that affects his ability to swallow, though it is not due to a mechanical obstruction or choking sensation. A feeding tube is in place to maintain nutrition. His weight has fluctuated, recently reaching a low of 102 pounds, but he has gained up to 109 pounds. A slight decrease in weight was noted recently due to disruptions in his eating schedule from medical appointments. His caregiver observes that he has become more alert and open to eating since the feeding tube was placed, and hydration has improved his overall condition.  He has a history of significant lung function impairment, with only 20-27% capacity remaining, attributed to past smoking. He quit smoking several years ago with the help of his caregiver. He experiences shortness of breath, which limits his ability to walk long distances, although he can move around the house without assistance. He uses a wheelchair for longer distances.  He has been experiencing headaches for the past week to week and a half, localized to the front of his head. No sinus congestion or runny nose.  MEDICAL HISTORY:  Past Medical History:  Diagnosis Date   COPD (chronic obstructive pulmonary disease) (HCC)    Hypertension     SURGICAL HISTORY: Past Surgical History:  Procedure Laterality Date   HERNIA REPAIR     REPAIR OF PERFORATED ULCER     TOTAL HIP ARTHROPLASTY Right 05/08/2016   Procedure: TOTAL HIP ARTHROPLASTY ANTERIOR APPROACH;  Surgeon: Tarry Kos, MD;  Location: [facility] OR;  Service: Orthopedics;  Laterality: Right;    SOCIAL HISTORY: Social History   Socioeconomic History   Marital status: Married    Spouse name: Not on file   Number of children: Not on file   Years of education: Not on file    Highest education level: Not on file  Occupational History   Not on file  Tobacco Use   Smoking status: Former    Current packs/day: 0.00    Types: Cigarettes    Quit date: 12/20/2023    Years since quitting: 0.0   Smokeless tobacco: Never  Substance and Sexual Activity   Alcohol use: Yes    Comment: daily   Drug use: No   Sexual activity: Not on file  Other Topics Concern   Not on file  Social History Narrative   Not on file   Social Drivers of Health   Financial Resource Strain: Not on file  Food Insecurity: No Food Insecurity (01/12/2024)   Hunger Vital Sign    Worried About Running Out of Food in the Last Year: Never true    Ran Out of Food in the Last Year: Never true  Transportation Needs: No Transportation Needs (01/12/2024)   PRAPARE - Administrator, Civil Service (Medical): No    Lack of Transportation (Non-Medical): No  Physical Activity: Not on file  Stress: Not on file  Social Connections: Not on file  Intimate Partner Violence: Not At Risk (01/12/2024)   Humiliation, Afraid, Rape, and Kick questionnaire    Fear of Current or Ex-Partner: No  Emotionally Abused: No    Physically Abused: No    Sexually Abused: No    FAMILY HISTORY: Family History  Problem Relation Age of Onset   Cancer Mother    Coronary artery disease Father     ALLERGIES:  He has no known allergies.  MEDICATIONS:  Current Outpatient Medications  Medication Sig Dispense Refill   acetaminophen (TYLENOL) 500 MG tablet Take 1,000 mg by mouth every 8 (eight) hours as needed.     albuterol (PROVENTIL HFA;VENTOLIN HFA) 108 (90 Base) MCG/ACT inhaler Inhale 2 puffs into the lungs every 6 (six) hours as needed for wheezing or shortness of breath.      alendronate (FOSAMAX) 70 MG tablet Take 70 mg by mouth every Friday. Take with a full glass of water on an empty stomach.      amLODipine (NORVASC) 5 MG tablet Take 10 mg by mouth daily.     cholecalciferol (VITAMIN D) 1000 units tablet  Take 2,000 Units by mouth daily.      cyanocobalamin (VITAMIN B12) 500 MCG tablet Take 500 mcg by mouth daily.     HYDROcodone-acetaminophen (NORCO/VICODIN) 5-325 MG tablet Take 1 tablet by mouth every 6 (six) hours as needed.     ibuprofen (ADVIL) 200 MG tablet Take 400 mg by mouth every 8 (eight) hours as needed for headache.     lidocaine (XYLOCAINE) 2 % solution Use as directed 15 mLs in the mouth or throat every 6 (six) hours as needed for mouth pain.     losartan (COZAAR) 50 MG tablet Take 50 mg by mouth daily.     metoprolol (LOPRESSOR) 50 MG tablet Take 50 mg by mouth daily.     oxyCODONE (OXY IR/ROXICODONE) 5 MG immediate release tablet Take 1-3 tablets (5-15 mg total) by mouth every 4 (four) hours as needed. 90 tablet 0   tiotropium (SPIRIVA) 18 MCG inhalation capsule Place 18 mcg into inhaler and inhale daily.     donepezil (ARICEPT) 5 MG tablet Take 10 mg by mouth daily. (Patient not taking: Reported on 12/22/2023)     No current facility-administered medications for this visit.    REVIEW OF SYSTEMS:    Review of Systems  Constitutional:  Positive for appetite change and fatigue.  HENT:   Positive for trouble swallowing.   Respiratory:  Positive for shortness of breath (With minimal exertion).     All other pertinent systems were reviewed with the patient and are negative.  PHYSICAL EXAMINATION:    Onc Performance Status - 01/12/24 0849       ECOG Perf Status   ECOG Perf Status Capable of only limited selfcare, confined to bed or chair more than 50% of waking hours      KPS SCALE   KPS % SCORE Requires occasional assistance but is able to care for most needs             Vitals:   01/12/24 0827  BP: 124/82  Pulse: 70  Resp: 16  Temp: 98.3 F (36.8 C)  SpO2: 97%   Filed Weights   01/12/24 0827  Weight: 107 lb 6.4 oz (48.7 kg)    Physical Exam Constitutional:      General: He is not in acute distress.    Appearance: Normal appearance.  HENT:      Head: Normocephalic and atraumatic.     Ears:     Comments: Hearing loss    Mouth/Throat:     Comments: Bilateral erosive tumor involving soft palate,  tonsillar fossae.  Edentulous Eyes:     General: No scleral icterus.    Conjunctiva/sclera: Conjunctivae normal.  Cardiovascular:     Rate and Rhythm: Normal rate and regular rhythm.     Heart sounds: Normal heart sounds.  Pulmonary:     Effort: Pulmonary effort is normal.     Breath sounds: Normal breath sounds.  Abdominal:     General: There is no distension.  Musculoskeletal:     Right lower leg: No edema.     Left lower leg: No edema.  Lymphadenopathy:     Cervical: Cervical adenopathy (right sided) present.  Skin:    Comments: Several actinic keratoses noted  Neurological:     General: No focal deficit present.     Mental Status: He is alert and oriented to person, place, and time.      LABORATORY DATA:   I have reviewed the data as listed.  Results for orders placed or performed in visit on 01/12/24  Rad Onc Aria Session Summary  Result Value Ref Range   Course ID [id number]    Course Start Date 01/05/2024    Session Number 3    Course First Treatment Date 01/10/2024 10:18 AM    Course Last Treatment Date 01/12/2024  5:28 PM    Course Elapsed Days 2    Reference Point ID HN_pharynx DP    Reference Point Dosage Given to Date 6 Gy   Reference Point Session Dosage Given 2 Gy   Plan ID HN_pharynx    Plan Fractions Treated to Date 3    Plan Total Fractions Prescribed 35    Plan Prescribed Dose Per Fraction 2 Gy   Plan Total Prescribed Dose 70.000000 Gy   Plan Primary Reference Point HN_pharynx DP   Results for orders placed or performed in visit on 01/12/24  TSH  Result Value Ref Range   TSH 1.775 0.350 - 4.500 uIU/mL  Magnesium  Result Value Ref Range   Magnesium 2.4 1.7 - 2.4 mg/dL  CMP (Cancer Center only)  Result Value Ref Range   Sodium 135 135 - 145 mmol/L   Potassium 4.3 3.5 - 5.1 mmol/L   Chloride 95 (L)  98 - 111 mmol/L   CO2 34 (H) 22 - 32 mmol/L   Glucose, Bld 103 (H) 70 - 99 mg/dL   BUN 14 8 - 23 mg/dL   Creatinine 1.61 0.96 - 1.24 mg/dL   Calcium 9.4 8.9 - 04.5 mg/dL   Total Protein 6.7 6.5 - 8.1 g/dL   Albumin 3.7 3.5 - 5.0 g/dL   AST 18 15 - 41 U/L   ALT 11 0 - 44 U/L   Alkaline Phosphatase 78 38 - 126 U/L   Total Bilirubin 0.4 0.0 - 1.2 mg/dL   GFR, Estimated >40 >98 mL/min   Anion gap 6 5 - 15  CBC with Differential (Cancer Center Only)  Result Value Ref Range   WBC Count 5.2 4.0 - 10.5 K/uL   RBC 3.45 (L) 4.22 - 5.81 MIL/uL   Hemoglobin 10.7 (L) 13.0 - 17.0 g/dL   HCT 11.9 (L) 14.7 - 82.9 %   MCV 91.9 80.0 - 100.0 fL   MCH 31.0 26.0 - 34.0 pg   MCHC 33.8 30.0 - 36.0 g/dL   RDW 56.2 13.0 - 86.5 %   Platelet Count 274 150 - 400 K/uL   nRBC 0.0 0.0 - 0.2 %   Neutrophils Relative % 64 %   Neutro Abs 3.3 1.7 -  7.7 K/uL   Lymphocytes Relative 26 %   Lymphs Abs 1.4 0.7 - 4.0 K/uL   Monocytes Relative 9 %   Monocytes Absolute 0.5 0.1 - 1.0 K/uL   Eosinophils Relative 0 %   Eosinophils Absolute 0.0 0.0 - 0.5 K/uL   Basophils Relative 0 %   Basophils Absolute 0.0 0.0 - 0.1 K/uL   Immature Granulocytes 1 %   Abs Immature Granulocytes 0.03 0.00 - 0.07 K/uL     RADIOGRAPHIC STUDIES:  PET scan 12/10/23  CLINICAL DATA:  Initial treatment strategy for head neck cancer.   EXAM: NUCLEAR MEDICINE PET SKULL BASE TO THIGH   TECHNIQUE: 5.5 mCi F-18 FDG was injected intravenously. Full-ring PET imaging was performed from the skull base to thigh after the radiotracer. CT data was obtained and used for attenuation correction and anatomic localization.   Fasting blood glucose: 111 mg/dl   COMPARISON:  CT neck and chest 12/03/2023.   FINDINGS: Mediastinal blood pool activity: SUV max 2.1   Liver activity: SUV max NA   NECK:   Hypermetabolic left nasopharyngeal hypermetabolism, SUV max 11.3, corresponding to mildly asymmetric soft tissue, measuring 1.3 x 1.7 cm. Focal  hypermetabolism in the deep/lateral right fossa of Rosenmller, SUV max 9.6. Correlation with CT is somewhat challenging without IV contrast. There does appear to be asymmetric soft tissue in this region, however. Posterior and left lateral oropharyngeal hypermetabolism, SUV max 16.6. Again, correlation with CT is challenging without IV contrast but mass measures approximately 2.0 x 5.0 cm (4/24). Hypermetabolic bilateral level II lymph nodes with index lymph node on the right measuring approximately 8 mm (4/20), SUV max 4.7.   Incidental CT findings:   None.   CHEST:   No abnormal hypermetabolism.   Incidental CT findings:   Atherosclerotic calcification of the aorta, aortic valve and coronary arteries. Heart size normal. No pericardial effusion. No pleural effusion. Centrilobular and paraseptal emphysema.   ABDOMEN/PELVIS:   No abnormal hypermetabolism.   Incidental CT findings:   Small low-attenuation lesion off the right kidney. No specific follow-up necessary.   SKELETON:   No abnormal hypermetabolism.   Incidental CT findings:   Right hip arthroplasty. Osteopenia. Degenerative changes in the spine.   IMPRESSION: 1. Hypermetabolic oropharyngeal mass with involvement of the deep/lateral right fossa of Rosenmller, left nasopharyngeal soft tissues and bilateral level II cervical lymph nodes. 2. Aortic atherosclerosis (ICD10-I70.0). Coronary artery calcification. 3.  Emphysema (ICD10-J43.9).     Electronically Signed   By: Leanna Battles M.D.   On: 12/13/2023 09:42  Orders Placed This Encounter  Procedures   CBC with Differential (Cancer Center Only)    Standing Status:   Future    Number of Occurrences:   1    Expiration Date:   01/11/2025   CMP (Cancer Center only)    Standing Status:   Future    Number of Occurrences:   1    Expiration Date:   01/11/2025   Magnesium    Standing Status:   Future    Number of Occurrences:   1    Expiration Date:    01/11/2025   TSH    Standing Status:   Future    Number of Occurrences:   1    Expiration Date:   01/11/2025    CODE STATUS:  Code Status History     Date Active Date Inactive Code Status Order ID Comments User Context   05/07/2016 2347 05/11/2016 1803 Full Code [id number]  Alberteen Sam,  MD Inpatient       Future Appointments  Date Time Provider Department Center  01/14/2024 12:50 PM [facility] LINAC 3 [facility] None  01/17/2024  2:00 PM [facility] CHEMO EDU [facility] None  01/17/2024  4:00 PM Neff, Barbara L, RD [facility] None  01/17/2024  4:45 PM [facility] LINAC 4 [facility] None  01/17/2024  5:00 PM LINAC-SQUIRE [facility] None  01/18/2024 11:15 AM [facility] LINAC 3 [facility] None  01/19/2024  1:30 PM [facility] LINAC 3 [facility] None  01/20/2024  1:30 PM [facility] LINAC 3 [facility] None  01/21/2024  1:30 PM [facility] LINAC 3 [facility] None  01/24/2024  1:30 PM [facility] LINAC 3 [facility] None  01/25/2024  1:30 PM [facility] LINAC 3 [facility] None  01/25/2024  1:45 PM Edwena Felty S, RD [facility] None  01/26/2024  1:30 PM [facility] LINAC 3 [facility] None  01/27/2024  1:30 PM [facility] LINAC 3 [facility] None  01/28/2024  1:30 PM [facility] LINAC 3 [facility] None  01/31/2024  1:30 PM [facility] LINAC 3 [facility] None  02/01/2024  1:30 PM [facility] LINAC 3 [facility] None  02/02/2024  1:30 PM [facility] LINAC 3 [facility] None  02/03/2024  1:30 PM [facility] LINAC 3 [facility] None  02/03/2024  1:45 PM Neff, Barbara L, RD [facility] None  02/04/2024  1:30 PM [facility] LINAC 4 [facility] None  02/07/2024  1:30 PM [facility] LINAC 4 [facility] None  02/08/2024  1:30 PM [facility] LINAC 4 [facility] None  02/09/2024  1:30 PM [facility] LINAC 4 [facility] None  02/10/2024  1:30 PM [facility] LINAC 4 [facility] None  02/10/2024  1:45 PM Edwena Felty S, RD [facility] None  02/11/2024  1:30 PM [facility] LINAC 4 [facility] None   02/14/2024  1:30 PM [facility] LINAC 4 [facility] None  02/15/2024  1:30 PM [facility] LINAC 4 [facility] None  02/16/2024  1:30 PM [facility] LINAC 4 [facility] None  02/17/2024  1:30 PM [facility] LINAC 4 [facility] None  02/17/2024  1:45 PM Neff, Barbara L, RD [facility] None  02/18/2024  1:30 PM [facility] LINAC 4 [facility] None  02/21/2024  1:30 PM [facility] LINAC 4 [facility] None  02/22/2024  1:30 PM [facility] LINAC 3 [facility] None  02/23/2024  1:30 PM [facility] LINAC 3 [facility] None  02/24/2024  1:30 PM [facility] LINAC 3 [facility] None  02/24/2024  2:30 PM Neff, Barbara L, RD [facility] None  02/25/2024  1:30 PM [facility] [id number] [facility] None     I spent a total of 60 minutes during this encounter with the patient including review of chart and various tests results, discussions about plan of care and coordination of care plan.  This document was completed utilizing speech recognition software. Grammatical errors, random word insertions, pronoun errors, and incomplete sentences are an occasional consequence of this system due to software limitations, ambient noise, and hardware issues. Any formal questions or concerns about the content, text or information contained within the body of this dictation should be directly addressed to the provider for clarification.

## 2024-01-13 ENCOUNTER — Other Ambulatory Visit: Payer: Self-pay

## 2024-01-13 ENCOUNTER — Other Ambulatory Visit (HOSPITAL_COMMUNITY): Payer: Self-pay

## 2024-01-13 ENCOUNTER — Ambulatory Visit
Admission: RE | Admit: 2024-01-13 | Discharge: 2024-01-13 | Disposition: A | Discharge status: Home / Self Care | Source: Ambulatory Visit | Attending: Radiation Oncology | Admitting: Radiation Oncology

## 2024-01-13 ENCOUNTER — Encounter: Payer: Self-pay | Admitting: Oncology

## 2024-01-13 ENCOUNTER — Telehealth: Payer: Self-pay | Admitting: Oncology

## 2024-01-13 DIAGNOSIS — C140 Malignant neoplasm of pharynx, unspecified: Secondary | ICD-10-CM | POA: Diagnosis not present

## 2024-01-13 DIAGNOSIS — E639 Nutritional deficiency, unspecified: Secondary | ICD-10-CM | POA: Insufficient documentation

## 2024-01-13 LAB — RAD ONC ARIA SESSION SUMMARY
Course Elapsed Days: 3
Plan Fractions Treated to Date: 4
Plan Prescribed Dose Per Fraction: 2 Gy
Plan Total Fractions Prescribed: 35
Plan Total Prescribed Dose: 70 Gy
Reference Point Dosage Given to Date: 8 Gy
Reference Point Session Dosage Given: 2 Gy
Session Number: 4

## 2024-01-13 MED ORDER — ONDANSETRON HCL 8 MG PO TABS
8.0000 mg | ORAL_TABLET | Freq: Three times a day (TID) | ORAL | 1 refills | Status: DC | PRN
  Filled 2024-01-13: qty 30, 10d supply, fill #0

## 2024-01-13 MED ORDER — PROCHLORPERAZINE MALEATE 10 MG PO TABS
10.0000 mg | ORAL_TABLET | Freq: Four times a day (QID) | ORAL | 1 refills | Status: DC | PRN
  Filled 2024-01-13: qty 30, 8d supply, fill #0

## 2024-01-13 MED ORDER — LIDOCAINE-PRILOCAINE 2.5-2.5 % EX CREA
TOPICAL_CREAM | CUTANEOUS | 3 refills | Status: DC
  Filled 2024-01-13: qty 30, 30d supply, fill #0

## 2024-01-13 MED ORDER — DEXAMETHASONE 4 MG PO TABS
ORAL_TABLET | ORAL | 1 refills | Status: DC
  Filled 2024-01-13: qty 30, 26d supply, fill #0

## 2024-01-13 NOTE — Assessment & Plan Note (Signed)
 Significant weight loss managed with feeding tube, leading to improved alertness and appetite. Hydration is crucial during radiation therapy. - Continue feeding tube use to maintain and improve nutritional status. - Monitor weight and nutritional intake.

## 2024-01-13 NOTE — Assessment & Plan Note (Signed)
 Reduced lung capacity due to smoking history, causing dyspnea and limiting physical activity. Smoking cessation is encouraged for lung health.  He recently quit smoking as per his wife. - Monitor respiratory status and manage symptoms as needed.

## 2024-01-13 NOTE — Telephone Encounter (Signed)
 I informed Elnita Maxwell of Julian Mejia's appointments.

## 2024-01-13 NOTE — Progress Notes (Signed)
 START ON PATHWAY REGIMEN - Head and Neck     A cycle is every 7 days, concurrent with RT:     Paclitaxel      Carboplatin   **Always confirm dose/schedule in your pharmacy ordering system**  Patient Characteristics: Oropharynx, HPV Negative/Unknown, Preoperative or Nonsurgical Candidate (Clinical Staging), Stage IVA/B Disease Classification: Oropharynx HPV Status: Negative (-) Therapeutic Status: Preoperative or Nonsurgical Candidate (Clinical Staging) AJCC T Category: cT4b AJCC 8 Stage Grouping: Unknown AJCC N Category: cN2c AJCC M Category: cM0 Intent of Therapy: Curative Intent, Discussed with Patient

## 2024-01-13 NOTE — Assessment & Plan Note (Addendum)
 Stage IV with bilateral lymph node involvement.   Discussed staging, plan of care, treatment options, prognosis and reviewed NCCN guidelines.  Surgery is not feasible. Radiation therapy is ongoing, started on 01/10/2024.    For his stage of disease, generally concurrent chemoradiation is recommended.  Chemotherapy is considered as an adjunct to enhance radiation efficacy but is not currently recommended due to limited functional status (ECOG PS 3) and potential risks outweighing benefits. Cisplatin, carboplatin, and Taxol are options, with cisplatin having significant side effects including nephrotoxicity and ototoxicity.   Radiation alone is viable given his condition. Goal is remission with radiation; chemotherapy would serve as a radiation sensitizer.   Decision to initiate chemotherapy depends on clinical improvement, particularly in nutrition and strength.  - Continue radiation therapy for seven weeks.  - Arrange chemotherapy education session to discuss potential side effects and management.  - Focus on improving nutrition and hydration to enhance strength and tolerance for potential future chemotherapy.  Creatinine 0.72.  Hemoglobin 10.7, otherwise unremarkable CBCD.  We will reevaluate him in the next 2 weeks to see if we can attempt reduced dose chemotherapy with carboplatin (AUC 1.5) and paclitaxel (25 mg/m) weekly during the course of radiation.  Tentatively we will schedule this around 01/26/2024.  Carboplatin and paclitaxel was chosen over cisplatin because of better side effect profile given his performance status.  We will not pursue Port-A-Cath placement and attempt chemo via peripheral IV.

## 2024-01-14 ENCOUNTER — Other Ambulatory Visit: Payer: Self-pay

## 2024-01-14 ENCOUNTER — Ambulatory Visit
Admission: RE | Admit: 2024-01-14 | Discharge: 2024-01-14 | Disposition: A | Discharge status: Home / Self Care | Source: Ambulatory Visit | Attending: Radiation Oncology | Admitting: Radiation Oncology

## 2024-01-14 ENCOUNTER — Encounter: Admitting: Dietician

## 2024-01-14 DIAGNOSIS — C140 Malignant neoplasm of pharynx, unspecified: Secondary | ICD-10-CM | POA: Diagnosis not present

## 2024-01-14 LAB — RAD ONC ARIA SESSION SUMMARY
Course Elapsed Days: 4
Plan Fractions Treated to Date: 5
Plan Prescribed Dose Per Fraction: 2 Gy
Plan Total Fractions Prescribed: 35
Plan Total Prescribed Dose: 70 Gy
Reference Point Dosage Given to Date: 10 Gy
Reference Point Session Dosage Given: 2 Gy
Session Number: 5

## 2024-01-17 ENCOUNTER — Ambulatory Visit
Admission: RE | Admit: 2024-01-17 | Discharge: 2024-01-17 | Disposition: A | Discharge status: Home / Self Care | Source: Ambulatory Visit | Attending: Radiation Oncology | Admitting: Radiation Oncology

## 2024-01-17 ENCOUNTER — Inpatient Hospital Stay

## 2024-01-17 ENCOUNTER — Other Ambulatory Visit: Payer: Self-pay | Admitting: Radiation Oncology

## 2024-01-17 ENCOUNTER — Other Ambulatory Visit: Payer: Self-pay

## 2024-01-17 ENCOUNTER — Inpatient Hospital Stay: Admitting: Nutrition

## 2024-01-17 DIAGNOSIS — C140 Malignant neoplasm of pharynx, unspecified: Secondary | ICD-10-CM

## 2024-01-17 LAB — RAD ONC ARIA SESSION SUMMARY
Course Elapsed Days: 7
Plan Fractions Treated to Date: 6
Plan Prescribed Dose Per Fraction: 2 Gy
Plan Total Fractions Prescribed: 35
Plan Total Prescribed Dose: 70 Gy
Reference Point Dosage Given to Date: 12 Gy
Reference Point Session Dosage Given: 2 Gy
Session Number: 6

## 2024-01-17 MED ORDER — ACETAMINOPHEN 160 MG/5ML PO SUSP: 800.0000 mg | Freq: Four times a day (QID) | ORAL | 3 refills | Status: AC | PRN

## 2024-01-17 MED ORDER — OXYCODONE HCL 5 MG/5ML PO SOLN: 5.0000 mg | Freq: Four times a day (QID) | ORAL | 0 refills | Status: AC | PRN

## 2024-01-17 NOTE — Progress Notes (Signed)
 Nutrition follow-up completed with patient in radiation therapy after treatment.  He is followed by Dr. Lurena Sally.  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  Final radiation therapy is scheduled for Friday, May 23.  Patient has decided not to have chemotherapy.  Patient has a G-tube which was placed at [facility] on March 25. [facility] is managing tube feeding and providing formula and supplies.  Weight documented as 106.8 pounds today.  This is decreased from 109 pounds 2 ounces on April 2.  Labs include glucose 103 on April 9.  Estimated nutrition needs: 1685-1927 cal, 65-85 g protein, greater than 1.9 L fluids  Patient hard of hearing so wife provided history.  Patient is only eating bites of food now.  He will take a little bit of applesauce or pudding.  Patient has painful swallowing.  They are requesting all medications be changed to liquid form.  He drinks 1 cup of coffee every morning.  He is taking sips of water and carbonated soft drink throughout the day.  He denies nausea and vomiting.  He also denies diarrhea and constipation.  His wife reports giving a stool softener if he takes a narcotic for pain. Patient was told to use between 5 and 6 cartons of formula every day but wife does not know the name of the formula.  She thinks it has a 1.5 on the label.  She is flushing feeding tube with 120 mL of water after bolus feedings 3 times a day.  Not giving additional water.  Nutrition diagnosis: Unintended weight loss, ongoing.  Intervention: Educated to give 2 cartons of formula 3 times daily with 60 mL of free water before and after bolus feedings. In addition start with an additional 240 mL free water flushes between feedings twice daily. Reach out to [facility] RD to clarify free water needs. Oral intake as tolerated.  Monitoring, evaluation, goals: Patient will tolerate adequate calories and protein to minimize weight loss.  Next visit: Tuesday,  April 22 after radiation therapy.  **Disclaimer: This note was dictated with voice recognition software. Similar sounding words can inadvertently be transcribed and this note may contain transcription errors which may not have been corrected upon publication of note.**

## 2024-01-17 NOTE — Progress Notes (Signed)
 Oncology Nurse Navigator Documentation   I received a phone call from Julian Mejia wife, Julian Mejia requesting that his chemotherapy education appointment for today be cancelled. She also verbalized that [name] has decided to not receive chemotherapy and will continue with radiation alone to treat his head and neck cancer. I have notified Dr. Randye Buttner and Dr. Lurena Sally of his decision. He and his wife know to call me if I can help with any further questions or concerns while he continues his radiation treatment.   Lynetta Saran RN, BSN, OCN Head & Neck Oncology Nurse Navigator [facility] at [facility] Phone # [phone]  Fax # [phone]

## 2024-01-18 ENCOUNTER — Ambulatory Visit
Admission: RE | Admit: 2024-01-18 | Discharge: 2024-01-18 | Disposition: A | Discharge status: Home / Self Care | Source: Ambulatory Visit | Attending: Radiation Oncology | Admitting: Radiation Oncology

## 2024-01-18 ENCOUNTER — Other Ambulatory Visit: Payer: Self-pay

## 2024-01-18 DIAGNOSIS — C140 Malignant neoplasm of pharynx, unspecified: Secondary | ICD-10-CM | POA: Diagnosis not present

## 2024-01-18 LAB — RAD ONC ARIA SESSION SUMMARY
Course Elapsed Days: 8
Plan Fractions Treated to Date: 7
Plan Prescribed Dose Per Fraction: 2 Gy
Plan Total Fractions Prescribed: 35
Plan Total Prescribed Dose: 70 Gy
Reference Point Dosage Given to Date: 14 Gy
Reference Point Session Dosage Given: 2 Gy
Session Number: 7

## 2024-01-19 ENCOUNTER — Ambulatory Visit
Admission: RE | Admit: 2024-01-19 | Discharge: 2024-01-19 | Disposition: A | Discharge status: Home / Self Care | Source: Ambulatory Visit | Attending: Radiation Oncology | Admitting: Radiation Oncology

## 2024-01-19 ENCOUNTER — Other Ambulatory Visit: Payer: Self-pay

## 2024-01-19 ENCOUNTER — Encounter: Admitting: Dietician

## 2024-01-19 DIAGNOSIS — C140 Malignant neoplasm of pharynx, unspecified: Secondary | ICD-10-CM | POA: Diagnosis not present

## 2024-01-19 LAB — RAD ONC ARIA SESSION SUMMARY
Course Elapsed Days: 9
Plan Fractions Treated to Date: 8
Plan Prescribed Dose Per Fraction: 2 Gy
Plan Total Fractions Prescribed: 35
Plan Total Prescribed Dose: 70 Gy
Reference Point Dosage Given to Date: 16 Gy
Reference Point Session Dosage Given: 2 Gy
Session Number: 8

## 2024-01-20 ENCOUNTER — Ambulatory Visit
Admission: RE | Admit: 2024-01-20 | Discharge: 2024-01-20 | Disposition: A | Discharge status: Home / Self Care | Source: Ambulatory Visit | Attending: Radiation Oncology | Admitting: Radiation Oncology

## 2024-01-20 ENCOUNTER — Other Ambulatory Visit: Payer: Self-pay

## 2024-01-20 ENCOUNTER — Ambulatory Visit: Attending: Radiation Oncology

## 2024-01-20 DIAGNOSIS — R1310 Dysphagia, unspecified: Secondary | ICD-10-CM | POA: Insufficient documentation

## 2024-01-20 DIAGNOSIS — C140 Malignant neoplasm of pharynx, unspecified: Secondary | ICD-10-CM | POA: Diagnosis not present

## 2024-01-20 LAB — RAD ONC ARIA SESSION SUMMARY
Course Elapsed Days: 10
Plan Fractions Treated to Date: 9
Plan Prescribed Dose Per Fraction: 2 Gy
Plan Total Fractions Prescribed: 35
Plan Total Prescribed Dose: 70 Gy
Reference Point Dosage Given to Date: 18 Gy
Reference Point Session Dosage Given: 2 Gy
Session Number: 9

## 2024-01-20 NOTE — Therapy (Signed)
 OUTPATIENT SPEECH LANGUAGE PATHOLOGY ONCOLOGY EVALUATION   Patient Name: Julian Mejia MRN: [id number] DOB:1951/06/18, 73 y.o., male Today's Date: 01/20/2024  PCP: Hadassah Letters, MD REFERRING PROVIDER: Dorathy Gals, MD  END OF SESSION:  End of Session - 01/20/24 1343     Visit Number 1    Number of Visits 7    Date for SLP Re-Evaluation 04/19/24    SLP Start Time 1236    SLP Stop Time  1310    SLP Time Calculation (min) 34 min    Activity Tolerance Patient tolerated treatment well             Past Medical History:  Diagnosis Date   COPD (chronic obstructive pulmonary disease) (HCC)    Hypertension    Past Surgical History:  Procedure Laterality Date   HERNIA REPAIR     REPAIR OF PERFORATED ULCER     TOTAL HIP ARTHROPLASTY Right 05/08/2016   Procedure: TOTAL HIP ARTHROPLASTY ANTERIOR APPROACH;  Surgeon: Wes Hamman, MD;  Location: [facility] OR;  Service: Orthopedics;  Laterality: Right;   Patient Active Problem List   Diagnosis Date Noted   Inadequate nutrition 01/13/2024   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 He presented to Dr. Dorathy Gals [facility]) with complaints of a sore throat and unintentional weight loss.  12/02/24 CT neck showing an asymmetric and suspicious soft tissue along the left lateral wall of the pharynx, most pronounced at the level of the uvula and extending inferiorly to the left hypopharynx. Scan also indicated a a small right level 2A lymph node is abnormally heterogeneous, measuring 6-7 mm at the greatest extend and a slightly larger heterogeneous right level 2 B lymph node. CT chest done then demonstrated no acute process or explanation for weight loss and no evidence of metastatic disease.  12/09/23 He saw Dr. Lexine Redder at [facility] who performed a flexible fiberoptic laryngoscopy showing a left pharyngeal lesion extending from the soft palate to the hypopharynx. The posterior soft palate, uvula, tongue base, epiglottis, aryepiglottic folds, hypopharynx, supraglottis, glottis and vallecula were visualized and appeared healthy without mucosal masses or lesions. 12/09/23 Biopsy of left tonsil revealed moderately differentiated keratinizing SCC. 12/10/23 PET revealed a hypermetabolic oropharyngeal mass measuring 1.3 x 1.7 cm with involvement of the deep/lateral right fossa of Rosenmller, left nasopharyngeal soft tissues and bilateral level II cervical lymph nodes. 12/22/23 consult with Dr. Lurena Sally. He also consulted with medical oncology at the [facility] and was eventually referred here for consult with Dr. Randye Buttner on 01/12/24. He started radiation and may receive chemotherapy. Treatment plan:  He will receive 35 fractions of radiation to his Pharynx and bilateral neck which started on 01/10/24,and will end on 02/25/24.  Pretreatment procedures:12/27/23 Full mouth dental extractions, 12/29/23 PEG placed  PAIN:  Are  you having pain? No  FALLS: Has patient fallen in last 6 months?  No  LIVING ENVIRONMENT: Lives with: lives with their family Lives in: House/apartment  PLOF:  Level of assistance: Independent with ADLs, Independent with IADLs Employment: Retired  PATIENT GOALS: Maintain WNL swallowing  OBJECTIVE:  Note:  Objective measures were completed at Evaluation unless otherwise noted. DIAGNOSTIC FINDINGS: see "pertinent history"  INSTRUMENTAL SWALLOW STUDY FINDINGS none  COGNITION: Overall cognitive status: History of cognitive impairments - at baseline - pt looking at wife for answers to medical hx questions.   LANGUAGE: Receptive and Expressive language appeared WNL.  ORAL MOTOR EXAMINATION: Overall status: WFL  MOTOR SPEECH: Overall motor speech: Appears intact; differences due to edentulous status  SUBJECTIVE DYSPHAGIA REPORTS:  Date of onset: ~4 weeks ago; progressive Reported symptoms: odynophagia  Current diet: Dysphagia 1 (puree) and thin liquids due to pain and edentulous status  Co-morbid voice changes: No  FACTORS WHICH MAY INCREASE RISK OF ADVERSE EVENT IN PRESENCE OF ASPIRATION:  General health: well appearing however will begin at least RT for head/neck cancer next week. Chemo decision pending.  Risk factors: tube present (PEG)   CLINICAL SWALLOW ASSESSMENT:   Dentition: missing dentition  Vocal quality at baseline: normal Patient directly observed with POs: Yes: dysphagia 1 (puree) and thin liquids  Feeding: able to feed self Liquids provided by: cup Oral phase signs and symptoms:  none noted today Pharyngeal phase signs and symptoms:  wincing in pain due to reported stinging , with applesauce                                                                                                                           TREATMENT DATE:   01/20/24: Research states the risk for dysphagia increases due to radiation and/or chemotherapy treatment due to a variety of factors, so SLP educated the pt about the possibility of reduced/limited ability for PO intake during rad tx. SLP also educated pt regarding possible changes to swallowing musculature after rad tx, and why adherence to dysphagia HEP provided today and PO consumption was necessary to inhibit muscle fibrosis following rad tx  and to mitigate muscle disuse atrophy. SLP informed pt why this would be detrimental to their swallowing status and to their pulmonary health. Pt demonstrated understanding of these things to SLP. SLP encouraged pt to safely eat and drink as deep into their radiation/chemotherapy as possible to provide the best possible long-term swallowing outcome for pt. SLP then developed an individualized HEP for pt involving oral and pharyngeal strengthening and ROM and pt was instructed how to perform these exercises, including SLP demonstration. After SLP demonstration, pt return demonstrated each exercise. SLP ensured pt performance was correct prior to educating pt on next exercise. Pt required occasional mod cues faded to modified independent to perform HEP. Wife was present and will assist pt with HEP PRN. Pt was instructed to complete this program 5-7 days/week, at least 20 reps a day until 6  months after his or her last day of rad tx, and then x2 a week after that, indefinitely.   PATIENT EDUCATION: Education details: late effects head/neck radiation on swallow function, HEP procedure, and modification to HEP when difficulty experienced with swallowing during and after radiation course Person educated: Patient Education method: Explanation, Demonstration, Verbal cues, and Handouts Education comprehension: verbalized understanding, returned demonstration, verbal cues required, and needs further education   ASSESSMENT:  CLINICAL IMPRESSION: Patient is a 73 y.o. M who was seen today for assessment of swallowing as they undergo radiation/chemoradiation therapy. Today pt ate  applesauce and drank thin liquids without overt s/s oral or pharyngeal difficulty, but winced in pain with applesauce (due to stinging). At this time pt swallowing is deemed WNL/WFL with these POs; currently he is PEG dependent so SLP stressed need for water during the day to decr prevalence of muscle disuse atrophy. No oral deficits, nor  overt s/sx aspiration were observed. There are no overt s/s aspiration PNA observed by SLP nor any reported by pt at this time. Data indicate that pt's swallow ability will likely decrease over the course of radiation/chemoradiation therapy and could very well decline over time following the conclusion of that therapy due to muscle disuse atrophy and/or muscle fibrosis. Pt will cont to need to be seen by SLP in order to assess safety of PO intake, assess the need for recommending any objective swallow assessment, and ensuring pt is correctly completing the individualized HEP.  OBJECTIVE IMPAIRMENTS: include memory and dysphagia. These impairments are limiting patient from safety when swallowing. Factors affecting potential to achieve goals and functional outcome are ability to learn/carryover information. Patient will benefit from skilled SLP services to address above impairments and improve overall function.  REHAB POTENTIAL: Good   GOALS: Goals reviewed with patient?  Yes, in general SHORT TERM GOALS: Target: 3rd total session   1. Pt will compelte HEP with rare min A in 2 sessions Baseline: Goal status: INITIAL   2.  pt will tell SLP why pt is completing HEP with min A Baseline:  Goal status: INITIAL   3.  pt or wife will describe 3 overt s/s aspiration PNA with modified independence Baseline:  Goal status: INITIAL   4.  pt or wife will tell SLP how a food journal could hasten return to a more normalized diet Baseline:  Goal status: INITIAL     LONG TERM GOALS: Target: 7th total session   1.  pt will complete HEP with modified independence over two visits Baseline:  Goal status: INITIAL   2.  pt or wife will describe how to modify HEP over time, and the timeline associated with reduction in HEP frequency with modified independence over two sessions Baseline:  Goal status: INITIAL     PLAN:   SLP FREQUENCY:  once approx every 4 weeks   SLP DURATION:  7 sessions    PLANNED INTERVENTIONS: Aspiration precaution training, Pharyngeal strengthening exercises, Diet toleration management , Trials of upgraded texture/liquids, SLP instruction and feedback, Compensatory strategies, and Patient/family education, [phone] (treatment of swallowing dysfunction and/or oral function for feeding)   Eleanore Junio, CCC-SLP 01/20/2024, 1:46 PM

## 2024-01-20 NOTE — Patient Instructions (Addendum)
 SWALLOWING EXERCISES Do these 5-6 days/week until 6 months after your last day of radiation, then 2 days per week afterwards You can use 1-2 drops of liquid to help you swallow, if your mouth gets dry  Effortful Swallows - Press your tongue against the roof of your mouth for 3 seconds, then swallow as hard as you can - Do at least 20 reps/day, in sets of 5-10  Masako Swallow - swallow with your tongue sticking out - Stick tongue out past your lips and gently bite tongue with your teeth - Swallow, while holding your tongue with your teeth - Do at least 20 reps/day, in sets of 5-10   Shaker Exercise - head lift - Lie flat on your back in your bed, the floor, or a couch  - Raise your head and look at your feet - KEEP YOUR SHOULDERS DOWN - HOLD FOR 45-60 SECONDS, then lower your head back down - Repeat 3 times, 2-3 times a day  [employer] - "squeeze swallow" exercise - Swallow, and squeeze tight to keep your Adam's Apple up - Hold the squeeze for 5-7 seconds - then relax - Do at least 20 reps/day, in sets of 5-10  5.   Chin tuck - Place a rolled up towel (4 inches wide) under your chin near your neck - Tuck your chin and push hard on the towel for 5 seconds - Do at least 20 reps/day, in sets of 5-10

## 2024-01-21 ENCOUNTER — Ambulatory Visit
Admission: RE | Admit: 2024-01-21 | Discharge: 2024-01-21 | Disposition: A | Discharge status: Home / Self Care | Source: Ambulatory Visit | Attending: Radiation Oncology | Admitting: Radiation Oncology

## 2024-01-21 ENCOUNTER — Other Ambulatory Visit: Payer: Self-pay

## 2024-01-21 DIAGNOSIS — C140 Malignant neoplasm of pharynx, unspecified: Secondary | ICD-10-CM | POA: Diagnosis not present

## 2024-01-21 LAB — RAD ONC ARIA SESSION SUMMARY
Course Elapsed Days: 11
Plan Fractions Treated to Date: 10
Plan Prescribed Dose Per Fraction: 2 Gy
Plan Total Fractions Prescribed: 35
Plan Total Prescribed Dose: 70 Gy
Reference Point Dosage Given to Date: 20 Gy
Reference Point Session Dosage Given: 2 Gy
Session Number: 10

## 2024-01-24 ENCOUNTER — Ambulatory Visit
Admission: RE | Admit: 2024-01-24 | Discharge: 2024-01-24 | Disposition: A | Discharge status: Home / Self Care | Source: Ambulatory Visit | Attending: Radiation Oncology | Admitting: Radiation Oncology

## 2024-01-24 ENCOUNTER — Other Ambulatory Visit (HOSPITAL_COMMUNITY): Payer: Self-pay

## 2024-01-24 ENCOUNTER — Other Ambulatory Visit: Payer: Self-pay

## 2024-01-24 ENCOUNTER — Other Ambulatory Visit: Payer: Self-pay | Admitting: Radiation Oncology

## 2024-01-24 DIAGNOSIS — C140 Malignant neoplasm of pharynx, unspecified: Secondary | ICD-10-CM

## 2024-01-24 LAB — RAD ONC ARIA SESSION SUMMARY
Course Elapsed Days: 14
Plan Fractions Treated to Date: 11
Plan Prescribed Dose Per Fraction: 2 Gy
Plan Total Fractions Prescribed: 35
Plan Total Prescribed Dose: 70 Gy
Reference Point Dosage Given to Date: 22 Gy
Reference Point Session Dosage Given: 2 Gy
Session Number: 11

## 2024-01-24 MED ORDER — LIDOCAINE VISCOUS HCL 2 % MT SOLN: OROMUCOSAL | 3 refills | Status: AC

## 2024-01-25 ENCOUNTER — Ambulatory Visit
Admission: RE | Admit: 2024-01-25 | Discharge: 2024-01-25 | Disposition: A | Discharge status: Home / Self Care | Source: Ambulatory Visit | Attending: Radiation Oncology | Admitting: Radiation Oncology

## 2024-01-25 ENCOUNTER — Other Ambulatory Visit: Payer: Self-pay

## 2024-01-25 ENCOUNTER — Inpatient Hospital Stay: Admitting: Dietician

## 2024-01-25 DIAGNOSIS — C140 Malignant neoplasm of pharynx, unspecified: Secondary | ICD-10-CM | POA: Diagnosis not present

## 2024-01-25 LAB — RAD ONC ARIA SESSION SUMMARY
Course Elapsed Days: 15
Plan Fractions Treated to Date: 12
Plan Prescribed Dose Per Fraction: 2 Gy
Plan Total Fractions Prescribed: 35
Plan Total Prescribed Dose: 70 Gy
Reference Point Dosage Given to Date: 24 Gy
Reference Point Session Dosage Given: 2 Gy
Session Number: 12

## 2024-01-25 NOTE — Progress Notes (Signed)
 Nutrition Follow-up:  Patient with stage IV SCC of left tonsil. He is receiving radiotherapy under the care of Dr. Lurena Sally. S/p PEG  Met with patient and wife following RT. Patient is not eating/drinking orally other than a little bit of coffee and sips of water with medication. Sometimes will have a bite of pudding. Wife reports reaching TF goal of 6 cartons Osmolite 1.5 over the weekend. Patient tolerating 2 cartons TID with 180 ml water each feeding. This provides 2130 kcal, 89.4g, 1086 ml free water (1626 ml total water). Tube feedings are managed by [facility]. Wife reports noticing thick saliva this morning. Patient reports throat pain is managed with liquid tylenol . He is taking colace as needed for mild constipation.    Medications: lidocaine   Labs: no new labs   Anthropometrics: Last wt 103 lb on 4/21 Dennard Fisher) decreased 3% in one week - this is severe  4/14 - 106.8 lb  4/2 - 109.2 lb   Estimated Energy Needs  Kcals: 1610-9604 Protein: 65-85 Fluid: >1.9 L  NUTRITION DIAGNOSIS: Unintended wt loss - ongoing   INTERVENTION:  Continue Osmolite 1.5 - 2 cartons TID with 90 ml water before and after each bolus Encourage additional 240 ml (1 cup) water via tube to meet hydration needs (suggested increasing water flush with medications) Wife to speak with [facility] today (4/22) - consider adding daily protein modular (written down for pt) Will closely monitor wt. Pt reached TF goal 2-3 days before weekly wt check. TF at goal providing >100% of estimated calorie/protein needs    MONITORING, EVALUATION, GOAL: wt trends, TF   NEXT VISIT: Thursday May 1 with Felipa Horsfall (pt aware)

## 2024-01-26 ENCOUNTER — Ambulatory Visit

## 2024-01-26 ENCOUNTER — Other Ambulatory Visit

## 2024-01-26 ENCOUNTER — Ambulatory Visit
Admission: RE | Admit: 2024-01-26 | Discharge: 2024-01-26 | Disposition: A | Discharge status: Home / Self Care | Source: Ambulatory Visit | Attending: Radiation Oncology | Admitting: Radiation Oncology

## 2024-01-26 ENCOUNTER — Ambulatory Visit: Admitting: Oncology

## 2024-01-26 ENCOUNTER — Other Ambulatory Visit: Payer: Self-pay

## 2024-01-26 DIAGNOSIS — C140 Malignant neoplasm of pharynx, unspecified: Secondary | ICD-10-CM | POA: Diagnosis not present

## 2024-01-26 LAB — RAD ONC ARIA SESSION SUMMARY
Course Elapsed Days: 16
Plan Fractions Treated to Date: 13
Plan Prescribed Dose Per Fraction: 2 Gy
Plan Total Fractions Prescribed: 35
Plan Total Prescribed Dose: 70 Gy
Reference Point Dosage Given to Date: 26 Gy
Reference Point Session Dosage Given: 2 Gy
Session Number: 13

## 2024-01-27 ENCOUNTER — Other Ambulatory Visit: Payer: Self-pay

## 2024-01-27 ENCOUNTER — Ambulatory Visit
Admission: RE | Admit: 2024-01-27 | Discharge: 2024-01-27 | Disposition: A | Discharge status: Home / Self Care | Source: Ambulatory Visit | Attending: Radiation Oncology | Admitting: Radiation Oncology

## 2024-01-27 ENCOUNTER — Ambulatory Visit

## 2024-01-27 DIAGNOSIS — C140 Malignant neoplasm of pharynx, unspecified: Secondary | ICD-10-CM | POA: Diagnosis not present

## 2024-01-27 LAB — RAD ONC ARIA SESSION SUMMARY
Course Elapsed Days: 17
Plan Fractions Treated to Date: 14
Plan Prescribed Dose Per Fraction: 2 Gy
Plan Total Fractions Prescribed: 35
Plan Total Prescribed Dose: 70 Gy
Reference Point Dosage Given to Date: 28 Gy
Reference Point Session Dosage Given: 2 Gy
Session Number: 14

## 2024-01-28 ENCOUNTER — Other Ambulatory Visit: Payer: Self-pay

## 2024-01-28 ENCOUNTER — Ambulatory Visit
Admission: RE | Admit: 2024-01-28 | Discharge: 2024-01-28 | Disposition: A | Discharge status: Home / Self Care | Source: Ambulatory Visit | Attending: Radiation Oncology | Admitting: Radiation Oncology

## 2024-01-28 DIAGNOSIS — C140 Malignant neoplasm of pharynx, unspecified: Secondary | ICD-10-CM | POA: Diagnosis not present

## 2024-01-28 LAB — RAD ONC ARIA SESSION SUMMARY
Course Elapsed Days: 18
Plan Fractions Treated to Date: 15
Plan Prescribed Dose Per Fraction: 2 Gy
Plan Total Fractions Prescribed: 35
Plan Total Prescribed Dose: 70 Gy
Reference Point Dosage Given to Date: 30 Gy
Reference Point Session Dosage Given: 2 Gy
Session Number: 15

## 2024-01-31 ENCOUNTER — Ambulatory Visit
Admission: RE | Admit: 2024-01-31 | Discharge: 2024-01-31 | Disposition: A | Discharge status: Home / Self Care | Source: Ambulatory Visit | Attending: Radiation Oncology | Admitting: Radiation Oncology

## 2024-01-31 ENCOUNTER — Other Ambulatory Visit: Payer: Self-pay

## 2024-01-31 DIAGNOSIS — C140 Malignant neoplasm of pharynx, unspecified: Secondary | ICD-10-CM | POA: Diagnosis not present

## 2024-01-31 LAB — RAD ONC ARIA SESSION SUMMARY
Course Elapsed Days: 21
Plan Fractions Treated to Date: 16
Plan Prescribed Dose Per Fraction: 2 Gy
Plan Total Fractions Prescribed: 35
Plan Total Prescribed Dose: 70 Gy
Reference Point Dosage Given to Date: 32 Gy
Reference Point Session Dosage Given: 2 Gy
Session Number: 16

## 2024-02-01 ENCOUNTER — Ambulatory Visit
Admission: RE | Admit: 2024-02-01 | Discharge: 2024-02-01 | Disposition: A | Discharge status: Home / Self Care | Source: Ambulatory Visit | Attending: Radiation Oncology | Admitting: Radiation Oncology

## 2024-02-01 ENCOUNTER — Other Ambulatory Visit: Payer: Self-pay

## 2024-02-01 DIAGNOSIS — C140 Malignant neoplasm of pharynx, unspecified: Secondary | ICD-10-CM | POA: Diagnosis not present

## 2024-02-01 LAB — RAD ONC ARIA SESSION SUMMARY
Course Elapsed Days: 22
Plan Fractions Treated to Date: 17
Plan Prescribed Dose Per Fraction: 2 Gy
Plan Total Fractions Prescribed: 35
Plan Total Prescribed Dose: 70 Gy
Reference Point Dosage Given to Date: 34 Gy
Reference Point Session Dosage Given: 2 Gy
Session Number: 17

## 2024-02-02 ENCOUNTER — Other Ambulatory Visit: Payer: Self-pay

## 2024-02-02 ENCOUNTER — Encounter: Payer: Self-pay | Admitting: Nutrition

## 2024-02-02 ENCOUNTER — Ambulatory Visit
Admission: RE | Admit: 2024-02-02 | Discharge: 2024-02-02 | Disposition: A | Discharge status: Home / Self Care | Source: Ambulatory Visit | Attending: Radiation Oncology | Admitting: Radiation Oncology

## 2024-02-02 DIAGNOSIS — C140 Malignant neoplasm of pharynx, unspecified: Secondary | ICD-10-CM | POA: Diagnosis not present

## 2024-02-02 DIAGNOSIS — C109 Malignant neoplasm of oropharynx, unspecified: Secondary | ICD-10-CM

## 2024-02-02 LAB — RAD ONC ARIA SESSION SUMMARY
Course Elapsed Days: 23
Plan Fractions Treated to Date: 18
Plan Prescribed Dose Per Fraction: 2 Gy
Plan Total Fractions Prescribed: 35
Plan Total Prescribed Dose: 70 Gy
Reference Point Dosage Given to Date: 36 Gy
Reference Point Session Dosage Given: 2 Gy
Session Number: 18

## 2024-02-02 NOTE — Progress Notes (Signed)
 Patient's wife cancelled nutrition appointment on Thursday, May 1.

## 2024-02-03 ENCOUNTER — Other Ambulatory Visit: Payer: Self-pay

## 2024-02-03 ENCOUNTER — Inpatient Hospital Stay: Admitting: Nutrition

## 2024-02-03 ENCOUNTER — Encounter: Payer: Self-pay | Admitting: Physical Therapy

## 2024-02-03 ENCOUNTER — Ambulatory Visit: Attending: Radiation Oncology | Admitting: Physical Therapy

## 2024-02-03 ENCOUNTER — Ambulatory Visit
Admission: RE | Admit: 2024-02-03 | Discharge: 2024-02-03 | Disposition: A | Discharge status: Home / Self Care | Source: Ambulatory Visit | Attending: Radiation Oncology | Admitting: Radiation Oncology

## 2024-02-03 DIAGNOSIS — C109 Malignant neoplasm of oropharynx, unspecified: Secondary | ICD-10-CM | POA: Insufficient documentation

## 2024-02-03 DIAGNOSIS — R293 Abnormal posture: Secondary | ICD-10-CM | POA: Diagnosis present

## 2024-02-03 DIAGNOSIS — C140 Malignant neoplasm of pharynx, unspecified: Secondary | ICD-10-CM | POA: Diagnosis present

## 2024-02-03 LAB — RAD ONC ARIA SESSION SUMMARY
Course Elapsed Days: 24
Plan Fractions Treated to Date: 19
Plan Prescribed Dose Per Fraction: 2 Gy
Plan Total Fractions Prescribed: 35
Plan Total Prescribed Dose: 70 Gy
Reference Point Dosage Given to Date: 38 Gy
Reference Point Session Dosage Given: 2 Gy
Session Number: 19

## 2024-02-03 NOTE — Therapy (Signed)
 OUTPATIENT PHYSICAL THERAPY HEAD AND NECK BASELINE EVALUATION   Patient Name: Julian Mejia MRN: [id number] DOB:[birth year], 73 y.o., male Today's Date: 02/03/2024  END OF SESSION:  PT End of Session - 02/03/24 1112     Visit Number 1    Number of Visits 2    Date for PT Re-Evaluation 03/30/24    PT Start Time 1045    PT Stop Time 1115    PT Time Calculation (min) 30 min    Activity Tolerance Patient tolerated treatment well    Behavior During Therapy WFL for tasks assessed/performed             Past Medical History:  Diagnosis Date   COPD (chronic obstructive pulmonary disease) (HCC)    Hypertension    Past Surgical History:  Procedure Laterality Date   HERNIA REPAIR     REPAIR OF PERFORATED ULCER     TOTAL HIP ARTHROPLASTY Right 05/08/2016   Procedure: TOTAL HIP ARTHROPLASTY ANTERIOR APPROACH;  Surgeon: Wes Hamman, MD;  Location: [facility] OR;  Service: Orthopedics;  Laterality: Right;   Patient Active Problem List   Diagnosis Date Noted   Inadequate nutrition 01/13/2024   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   Memory loss 05/08/2016   Hyponatremia 05/08/2016   Closed subcapital fracture of neck of right femur (HCC) 05/07/2016   Essential hypertension 05/07/2016   Chronic obstructive pulmonary disease (COPD) (HCC) 05/07/2016    PCP: Hadassah Letters, MD  REFERRING PROVIDER: Colie Dawes, MD  REFERRING DIAG: C10.9 (ICD-10-CM) - Squamous cell carcinoma of oropharynx (HCC)   THERAPY DIAG:  Abnormal posture  Oropharyngeal carcinoma (HCC)  Rationale for Evaluation and Treatment: Rehabilitation  ONSET DATE: 12/09/23  SUBJECTIVE:     SUBJECTIVE STATEMENT: Patient reports they are here today to be seen by their medical team for newly diagnosed cancer of left tonsil.    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 Dorathy Gals [facility]) with complaints of a  sore throat and unintentional weight loss. 12/02/24 CT neck showing an asymmetric and suspicious soft tissue along the left lateral wall of the pharynx, most pronounced at the level of the uvula and extending inferiorly to the left hypopharynx. Scan also indicated a a small right level 2A lymph node is abnormally heterogeneous, measuring 6-7 mm at the greatest extend and a slightly larger heterogeneous right level 2 B lymph node. CT chest done then demonstrated no acute process or explanation for weight loss and no evidence of metastatic disease.  12/09/23 He saw Dr. Lexine Redder at [facility] who performed a flexible fiberoptic laryngoscopy showing a left pharyngeal lesion extending from the soft palate to the hypopharynx. The posterior soft palate, uvula, tongue base, epiglottis, aryepiglottic folds, hypopharynx, supraglottis, glottis and vallecula were visualized and appeared healthy without mucosal masses or lesions. 12/09/23 Biopsy of left tonsil revealed moderately differentiated keratinizing SCC. 12/10/23 PET revealed a hypermetabolic oropharyngeal mass measuring 1.3 x 1.7 cm with involvement of the deep/lateral right fossa of Rosenmller, left nasopharyngeal soft tissues and bilateral level II cervical lymph nodes. 12/22/23 consult with Dr. Lurena Sally. He also consulted with medical oncology at the [facility] and was eventually referred here for consult with Dr. Randye Buttner on 01/12/24. He started radiation and may receive chemotherapy. He will receive 35 fractions of radiation to his Pharynx and bilateral neck which started on 01/10/24 and will complete on 02/25/24.  He will possibly start chemotherapy on  01/27/24 but will discuss with Dr. Randye Buttner 01/26/24. COPD  PATIENT GOALS:   to be educated about the signs and symptoms of lymphedema and learn post op HEP.   PAIN:  Are you having pain? No  PRECAUTIONS: Active CA and Joint replacement R hip replacement  RED FLAGS: None   WEIGHT BEARING RESTRICTIONS: No  FALLS:  Has patient fallen in  last 6 months? No Does the patient have a fear of falling that limits activity? No Is the patient reluctant to leave the house due to a fear of falling?No  LIVING ENVIRONMENT: Patient lives with: with wife  Lives in: House/apartment Has following equipment at home: Single point cane, Environmental consultant - 2 wheeled, Crutches, Wheelchair (manual), shower chair, Grab bars, and Ramped entry  OCCUPATION: retired  LEISURE: does not exercise, does have severe COPD  PRIOR LEVEL OF FUNCTION: Independent   OBJECTIVE: Note: Objective measures were completed at Evaluation unless otherwise noted.  COGNITION: Overall cognitive status: Within functional limits for tasks assessed                  POSTURE:  Forward head and rounded shoulders posture  30 SEC SIT TO STAND: Unable - pt able to stand from chair 1x without use of hands but had increased shortness of breath after this  SHOULDER AROM:   WFL functional but not full ROM   CERVICAL AROM: somewhat difficult to assess as pt was leaning forward in his wheelchair    Percent limited  Flexion   Extension   Right lateral flexion   Left lateral flexion   Right rotation   Left rotation     (Blank rows=not tested)  GAIT: Pt declined due to shortness of breath  PATIENT EDUCATION:  Education details: Neck ROM, importance of posture when sitting, standing and lying down, deep breathing, walking program and importance of staying active throughout treatment, CURE article on staying active, "Why exercise?" flyer, lymphedema and PT info Person educated: Patient Education method: Explanation, Demonstration, Handout Education comprehension: Patient verbalized understanding and returned demonstration  HOME EXERCISE PROGRAM: Patient was instructed today in a home exercise program today for head and neck range of motion exercises. These included active cervical flexion, active cervical extension, active cervical rotation to each direction, upper trap  stretch, and shoulder retraction. Patient was encouraged to do these 2-3 times a day, holding for 5 sec each and completing for 5 reps. Pt was educated that once this becomes easier then hold the stretches for 30-60 seconds.    ASSESSMENT:  CLINICAL IMPRESSION: Pt arrives to PT with recently diagnosed L tonsillar cancer. He will receive 35 fractions of radiation to his Pharynx and bilateral neck which started on 01/10/24 and will complete on 02/25/24.  Pt's cervical ROM was [facility]. Educated pt about signs and symptoms of lymphedema as well as anatomy and physiology of lymphatic system. Educated pt in importance of staying as active as possible throughout treatment to decrease fatigue as well as head and neck ROM exercises to decrease loss of ROM. Will see pt after completion of radiation to reassess ROM and assess for lymphedema and to determine therapy needs at that time.  Pt will benefit from skilled therapeutic intervention to improve on the following deficits: Decreased knowledge of precautions and postural dysfunction.    PT treatment/interventions: ADL/self-care home management, pt/family education, therapeutic exercise.   REHAB POTENTIAL: Good  CLINICAL DECISION MAKING: Evolving/moderate complexity  EVALUATION COMPLEXITY: Moderate   GOALS: Goals reviewed with patient? YES  LONG TERM  GOALS: (STG=LTG)   Name Target Date  Goal status  1 Patient will be able to verbalize understanding of a home exercise program for cervical range of motion, posture, and walking.   Baseline:  No knowledge 02/03/2024 Achieved at eval  2 Patient will be able to verbalize understanding of proper sitting and standing posture. Baseline:  No knowledge 02/03/2024 Achieved at eval  3 Patient will be able to verbalize understanding of lymphedema risk and availability of treatment for this condition Baseline:  No knowledge 02/03/2024 Achieved at eval  4 Pt will demonstrate a return to full cervical ROM and function post  operatively compared to baselines and not demonstrate any signs or symptoms of lymphedema.  Baseline: See objective measurements taken today. 03/30/24 New    PLAN:  PT FREQUENCY/DURATION: EVAL and 1 follow up appointment.   PLAN FOR NEXT SESSION: will reassess 2 weeks after completion of radiation to determine needs.  Patient will follow up at outpatient cancer rehab 2 weeks after completion of radiation.  If the patient requires physical therapy at that time, a specific plan will be dictated and sent to the referring physician for approval. The patient was educated today on appropriate basic range of motion exercises to begin now and continue throughout radiation and educated on the signs and symptoms of lymphedema. Patient verbalized good understanding.     Physical Therapy Information for During and After Head/Neck Cancer Treatment: Lymphedema is a swelling condition that you may be at risk for in your neck and/or face if you have radiation treatment to the area and/or if you have surgery that includes removing lymph nodes.  There is treatment available for this condition and it is not life-threatening.  Contact your physician or physical therapist with concerns. An excellent resource for those seeking information on lymphedema is the National Lymphedema Network's website.  It can be accessed at www.lymphnet.org If you notice swelling in your neck or face at any time following surgery (even if it is many years from now), please contact your doctor or physical therapist to discuss this.  Lymphedema can be treated at any time but it is easier for you if it is treated early on. If you have had surgery to your neck, please check with your surgeon about how soon to start doing neck range of motion exercises.  If you are not having surgery, I encourage you to start doing neck range of motion exercises today and continue these while undergoing treatment, UNLESS you have irritation of your skin or soft  tissue that is aggravated by doing them.  These exercises are intended to help you prevent loss of range of motion and/or to gain range of motion in your neck (which can be limited by tightening effects of radiation), and NOT to aggravate these tissues if they develop sensitivities from treatment. Neck range of motion exercises should be done to the point of feeling a GENTLE, TOLERABLE stretch only.  You are encouraged to start a walking or other exercise program tomorrow and continue this as much as you are able through and after treatment.  Please feel free to call me with any questions. Deedee Farmer, PT, CLT Physical Therapist and Certified Lymphedema Therapist [facility] [address], [place], [place] [address] [phone] [email or web address]  WALKING  Walking is a great form of exercise to increase your strength, endurance and overall fitness.  A walking program can help you start slowly and gradually build endurance as you go.  Everyone's  ability is different, so each person's starting point will be different.  You do not have to follow them exactly.  The are just samples. You should simply find out what's right for you and stick to that program.   In the beginning, you'll start off walking 2-3 times a day for short distances.  As you get stronger, you'll be walking further at just 1-2 times per day.  A. You Can Walk For A Certain Length Of Time Each Day    Walk 5 minutes 3 times per day.  Increase 2 minutes every 2 days (3 times per day).  Work up to 25-30 minutes (1-2 times per day).   Example:   Day 1-2 5 minutes 3 times per day   Day 7-8 12 minutes 2-3 times per day   Day 13-14 25 minutes 1-2 times per day  B. You Can Walk For a Certain Distance Each Day     Distance can be substituted for time.    Example:   3 trips to mailbox (at road)   3 trips to corner of block   3 trips around the block  C. Go to local high  school and use the track.    Walk for distance ____ around track  Or time ____ minutes  D. Walk ____ Jog ____ Run ___   Why exercise?  So many benefits! Here are SOME of them: Heart health, including raising your good cholesterol level and reducing heart rate and blood pressure Lung health, including improved lung capacity It burns fats, and most of us  can stand to be leaner, whether or not we are overweight. It increases the body's natural painkillers and mood elevators, so makes you feel better. Not only makes you feel better, but look better too Improves sleep Takes a bite out of stress May decrease your risk of many types of cancer If you are currently undergoing cancer treatment, exercise may improve your ability to tolerate treatments including chemotherapy. For everybody, it can improve your energy level. Those with cancer-related fatigue report a 40-50% reduction in this symptom when exercising regularly. If you are a survivor of breast, colon, or prostate cancer, it may decrease your risk of a recurrence. (This may hold for other cancers too, but so far we have data just for these three types.)  How to exercise: Get your doctor's okay. Pick something you enjoy doing, like walking, Zumba, biking, swimming, or whatever. Start at low intensity and time, then gradually increase.  (See walking program handout.) Set a goal to achieve over time.  The American Cancer Society, American Heart Association, and U.S. Dept. of Health and Human Services recommend 150 minutes of moderate exercise, 75 minutes of vigorous exercise, or a combination of both per week. This should be done in episodes at least 10 minutes long, spread throughout the week.  Need help being motivated? Pick something you enjoy doing, because you'll be more inclined to stick with that activity than something that feels like a chore. Do it with a friend so that you are accountable to each other. Schedule it into your  day. Place it on your calendar and keep that appointment just like you do any appointment that you make. Join an exercise group that meets at a specific time.  That way, you have to show up on time, and that makes it harder to procrastinate about doing your workout.  It also keeps you accountable--people begin to expect you to be there. Join a gym where  you feel comfortable and not intimidated, at the right cost. Sign up for something that you'll need to be in shape for on a specific date, like a 1K or a 5K to walk or run, a 20 or 30 mile bike ride, a mud run or something like that. If the date is looming, you know you'll need to train to be ready for it.  An added benefit is that many of these are fundraisers for good causes. If you've already paid for a gym membership, group exercise class or event, you might as well work out, so you haven't wasted your money!    [employer], PT 02/03/2024, 11:17 AM

## 2024-02-04 ENCOUNTER — Other Ambulatory Visit: Payer: Self-pay

## 2024-02-04 ENCOUNTER — Ambulatory Visit
Admission: RE | Admit: 2024-02-04 | Discharge: 2024-02-04 | Disposition: A | Discharge status: Home / Self Care | Source: Ambulatory Visit | Attending: Radiation Oncology | Admitting: Radiation Oncology

## 2024-02-04 DIAGNOSIS — C140 Malignant neoplasm of pharynx, unspecified: Secondary | ICD-10-CM | POA: Diagnosis not present

## 2024-02-04 LAB — RAD ONC ARIA SESSION SUMMARY
Course Elapsed Days: 25
Plan Fractions Treated to Date: 20
Plan Prescribed Dose Per Fraction: 2 Gy
Plan Total Fractions Prescribed: 35
Plan Total Prescribed Dose: 70 Gy
Reference Point Dosage Given to Date: 40 Gy
Reference Point Session Dosage Given: 2 Gy
Session Number: 20

## 2024-02-07 ENCOUNTER — Ambulatory Visit
Admission: RE | Admit: 2024-02-07 | Discharge: 2024-02-07 | Disposition: A | Discharge status: Home / Self Care | Source: Ambulatory Visit | Attending: Radiation Oncology | Admitting: Radiation Oncology

## 2024-02-07 ENCOUNTER — Other Ambulatory Visit: Payer: Self-pay

## 2024-02-07 DIAGNOSIS — C140 Malignant neoplasm of pharynx, unspecified: Secondary | ICD-10-CM | POA: Diagnosis not present

## 2024-02-07 LAB — RAD ONC ARIA SESSION SUMMARY
Course Elapsed Days: 28
Plan Fractions Treated to Date: 21
Plan Prescribed Dose Per Fraction: 2 Gy
Plan Total Fractions Prescribed: 35
Plan Total Prescribed Dose: 70 Gy
Reference Point Dosage Given to Date: 42 Gy
Reference Point Session Dosage Given: 2 Gy
Session Number: 21

## 2024-02-08 ENCOUNTER — Other Ambulatory Visit: Payer: Self-pay

## 2024-02-08 ENCOUNTER — Ambulatory Visit
Admission: RE | Admit: 2024-02-08 | Discharge: 2024-02-08 | Disposition: A | Discharge status: Home / Self Care | Source: Ambulatory Visit | Attending: Radiation Oncology | Admitting: Radiation Oncology

## 2024-02-08 DIAGNOSIS — C140 Malignant neoplasm of pharynx, unspecified: Secondary | ICD-10-CM | POA: Diagnosis not present

## 2024-02-08 LAB — RAD ONC ARIA SESSION SUMMARY
Course Elapsed Days: 29
Plan Fractions Treated to Date: 22
Plan Prescribed Dose Per Fraction: 2 Gy
Plan Total Fractions Prescribed: 35
Plan Total Prescribed Dose: 70 Gy
Reference Point Dosage Given to Date: 44 Gy
Reference Point Session Dosage Given: 2 Gy
Session Number: 22

## 2024-02-09 ENCOUNTER — Ambulatory Visit
Admission: RE | Admit: 2024-02-09 | Discharge: 2024-02-09 | Disposition: A | Discharge status: Home / Self Care | Source: Ambulatory Visit | Attending: Radiation Oncology | Admitting: Radiation Oncology

## 2024-02-09 ENCOUNTER — Other Ambulatory Visit: Payer: Self-pay

## 2024-02-09 ENCOUNTER — Other Ambulatory Visit: Payer: Self-pay | Admitting: Oncology

## 2024-02-09 DIAGNOSIS — C140 Malignant neoplasm of pharynx, unspecified: Secondary | ICD-10-CM | POA: Diagnosis not present

## 2024-02-09 LAB — RAD ONC ARIA SESSION SUMMARY
Course Elapsed Days: 30
Plan Fractions Treated to Date: 23
Plan Prescribed Dose Per Fraction: 2 Gy
Plan Total Fractions Prescribed: 35
Plan Total Prescribed Dose: 70 Gy
Reference Point Dosage Given to Date: 46 Gy
Reference Point Session Dosage Given: 2 Gy
Session Number: 23

## 2024-02-10 ENCOUNTER — Other Ambulatory Visit: Payer: Self-pay

## 2024-02-10 ENCOUNTER — Ambulatory Visit
Admission: RE | Admit: 2024-02-10 | Discharge: 2024-02-10 | Disposition: A | Discharge status: Home / Self Care | Source: Ambulatory Visit | Attending: Radiation Oncology | Admitting: Radiation Oncology

## 2024-02-10 ENCOUNTER — Inpatient Hospital Stay: Attending: Radiation Oncology | Admitting: Dietician

## 2024-02-10 DIAGNOSIS — C140 Malignant neoplasm of pharynx, unspecified: Secondary | ICD-10-CM | POA: Diagnosis not present

## 2024-02-10 LAB — RAD ONC ARIA SESSION SUMMARY
Course Elapsed Days: 31
Plan Fractions Treated to Date: 24
Plan Prescribed Dose Per Fraction: 2 Gy
Plan Total Fractions Prescribed: 35
Plan Total Prescribed Dose: 70 Gy
Reference Point Dosage Given to Date: 48 Gy
Reference Point Session Dosage Given: 2 Gy
Session Number: 24

## 2024-02-10 NOTE — Progress Notes (Signed)
 Patient did not show for nutrition appointment today. Will plan to see pt 5/15 as scheduled.

## 2024-02-11 ENCOUNTER — Other Ambulatory Visit: Payer: Self-pay

## 2024-02-11 ENCOUNTER — Ambulatory Visit
Admission: RE | Admit: 2024-02-11 | Discharge: 2024-02-11 | Disposition: A | Discharge status: Home / Self Care | Source: Ambulatory Visit | Attending: Radiation Oncology | Admitting: Radiation Oncology

## 2024-02-11 DIAGNOSIS — C140 Malignant neoplasm of pharynx, unspecified: Secondary | ICD-10-CM | POA: Diagnosis not present

## 2024-02-11 LAB — RAD ONC ARIA SESSION SUMMARY
Course Elapsed Days: 32
Plan Fractions Treated to Date: 25
Plan Prescribed Dose Per Fraction: 2 Gy
Plan Total Fractions Prescribed: 35
Plan Total Prescribed Dose: 70 Gy
Reference Point Dosage Given to Date: 50 Gy
Reference Point Session Dosage Given: 2 Gy
Session Number: 25

## 2024-02-14 ENCOUNTER — Other Ambulatory Visit: Payer: Self-pay

## 2024-02-14 ENCOUNTER — Ambulatory Visit
Admission: RE | Admit: 2024-02-14 | Discharge: 2024-02-14 | Disposition: A | Discharge status: Home / Self Care | Source: Ambulatory Visit | Attending: Radiation Oncology | Admitting: Radiation Oncology

## 2024-02-14 DIAGNOSIS — C140 Malignant neoplasm of pharynx, unspecified: Secondary | ICD-10-CM | POA: Diagnosis not present

## 2024-02-14 LAB — RAD ONC ARIA SESSION SUMMARY
Course Elapsed Days: 35
Plan Fractions Treated to Date: 26
Plan Prescribed Dose Per Fraction: 2 Gy
Plan Total Fractions Prescribed: 35
Plan Total Prescribed Dose: 70 Gy
Reference Point Dosage Given to Date: 52 Gy
Reference Point Session Dosage Given: 2 Gy
Session Number: 26

## 2024-02-15 ENCOUNTER — Ambulatory Visit
Admission: RE | Admit: 2024-02-15 | Discharge: 2024-02-15 | Disposition: A | Discharge status: Home / Self Care | Source: Ambulatory Visit | Attending: Radiation Oncology | Admitting: Radiation Oncology

## 2024-02-15 ENCOUNTER — Other Ambulatory Visit: Payer: Self-pay

## 2024-02-15 DIAGNOSIS — C140 Malignant neoplasm of pharynx, unspecified: Secondary | ICD-10-CM | POA: Diagnosis not present

## 2024-02-15 LAB — RAD ONC ARIA SESSION SUMMARY
Course Elapsed Days: 36
Plan Fractions Treated to Date: 27
Plan Prescribed Dose Per Fraction: 2 Gy
Plan Total Fractions Prescribed: 35
Plan Total Prescribed Dose: 70 Gy
Reference Point Dosage Given to Date: 54 Gy
Reference Point Session Dosage Given: 2 Gy
Session Number: 27

## 2024-02-16 ENCOUNTER — Ambulatory Visit
Admission: RE | Admit: 2024-02-16 | Discharge: 2024-02-16 | Disposition: A | Discharge status: Home / Self Care | Source: Ambulatory Visit | Attending: Radiation Oncology | Admitting: Radiation Oncology

## 2024-02-16 ENCOUNTER — Other Ambulatory Visit: Payer: Self-pay

## 2024-02-16 DIAGNOSIS — C140 Malignant neoplasm of pharynx, unspecified: Secondary | ICD-10-CM | POA: Diagnosis not present

## 2024-02-16 LAB — RAD ONC ARIA SESSION SUMMARY
Course Elapsed Days: 37
Plan Fractions Treated to Date: 28
Plan Prescribed Dose Per Fraction: 2 Gy
Plan Total Fractions Prescribed: 35
Plan Total Prescribed Dose: 70 Gy
Reference Point Dosage Given to Date: 56 Gy
Reference Point Session Dosage Given: 2 Gy
Session Number: 28

## 2024-02-17 ENCOUNTER — Other Ambulatory Visit: Payer: Self-pay

## 2024-02-17 ENCOUNTER — Encounter: Payer: Self-pay | Admitting: Nutrition

## 2024-02-17 ENCOUNTER — Ambulatory Visit: Attending: Radiation Oncology

## 2024-02-17 ENCOUNTER — Inpatient Hospital Stay: Admitting: Nutrition

## 2024-02-17 ENCOUNTER — Ambulatory Visit
Admission: RE | Admit: 2024-02-17 | Discharge: 2024-02-17 | Disposition: A | Discharge status: Home / Self Care | Source: Ambulatory Visit | Attending: Radiation Oncology | Admitting: Radiation Oncology

## 2024-02-17 DIAGNOSIS — R1310 Dysphagia, unspecified: Secondary | ICD-10-CM | POA: Diagnosis present

## 2024-02-17 DIAGNOSIS — C140 Malignant neoplasm of pharynx, unspecified: Secondary | ICD-10-CM | POA: Diagnosis not present

## 2024-02-17 LAB — RAD ONC ARIA SESSION SUMMARY
Course Elapsed Days: 38
Plan Fractions Treated to Date: 29
Plan Prescribed Dose Per Fraction: 2 Gy
Plan Total Fractions Prescribed: 35
Plan Total Prescribed Dose: 70 Gy
Reference Point Dosage Given to Date: 58 Gy
Reference Point Session Dosage Given: 2 Gy
Session Number: 29

## 2024-02-17 NOTE — Therapy (Signed)
 OUTPATIENT SPEECH LANGUAGE PATHOLOGY ONCOLOGY TREATMENT   Patient Name: Julian Mejia MRN: [id number] DOB:Aug 14, 1951, 73 y.o., male Today's Date: 02/17/2024  PCP: Hadassah Letters, MD REFERRING PROVIDER: Dorathy Gals, MD  END OF SESSION:  End of Session - 02/17/24 1137     Visit Number 2    Number of Visits 7    Date for SLP Re-Evaluation 04/19/24    SLP Start Time 1103    SLP Stop Time  1128    SLP Time Calculation (min) 25 min    Activity Tolerance Patient tolerated treatment well              Past Medical History:  Diagnosis Date   COPD (chronic obstructive pulmonary disease) (HCC)    Hypertension    Past Surgical History:  Procedure Laterality Date   HERNIA REPAIR     REPAIR OF PERFORATED ULCER     TOTAL HIP ARTHROPLASTY Right 05/08/2016   Procedure: TOTAL HIP ARTHROPLASTY ANTERIOR APPROACH;  Surgeon: Wes Hamman, MD;  Location: [facility] OR;  Service: Orthopedics;  Laterality: Right;   Patient Active Problem List   Diagnosis Date Noted   Inadequate nutrition 01/13/2024   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 He presented to Dr. Dorathy Gals [facility]) with complaints of a sore throat and unintentional weight loss.  12/02/24 CT neck showing an asymmetric and suspicious soft tissue along the left lateral wall of the pharynx, most pronounced at the level of the uvula and extending inferiorly to the left hypopharynx. Scan also indicated a a small right level 2A lymph node is abnormally heterogeneous, measuring 6-7 mm at the greatest extend and a slightly larger heterogeneous right level 2 B lymph node. CT chest done then demonstrated no acute process or explanation for weight loss and no evidence of metastatic disease.  12/09/23 He saw Dr. Lexine Redder at [facility] who performed a flexible fiberoptic laryngoscopy showing a left pharyngeal lesion extending from the soft palate to the hypopharynx. The posterior soft palate, uvula, tongue base, epiglottis, aryepiglottic folds, hypopharynx, supraglottis, glottis and vallecula were visualized and appeared healthy without mucosal masses or lesions. 12/09/23 Biopsy of left tonsil revealed moderately differentiated keratinizing SCC. 12/10/23 PET revealed a hypermetabolic oropharyngeal mass measuring 1.3 x 1.7 cm with involvement of the deep/lateral right fossa of Rosenmller, left nasopharyngeal soft tissues and bilateral level II cervical lymph nodes. 12/22/23 consult with Dr. Lurena Sally. He also consulted with medical oncology at the [facility] and was eventually referred here for consult with Dr. Randye Buttner on 01/12/24. He started radiation and may receive chemotherapy. Treatment plan:  He will receive 35 fractions of radiation to his Pharynx and bilateral neck which started on 01/10/24,and will end on 02/25/24.  Pretreatment procedures:12/27/23 Full mouth dental extractions, 12/29/23 PEG placed  PAIN:  Are  you having pain? No  FALLS: Has patient fallen in last 6 months?  No  PATIENT GOALS: Maintain WNL swallowing  OBJECTIVE:  Note: Objective measures were completed at Evaluation unless otherwise noted. DIAGNOSTIC FINDINGS: see "pertinent history"                                                                                                                             TREATMENT DATE:   02/17/24: Pt is in bed at home, other than POs, or coming to [facility] [place] [facility]. SLP stressed need for cont'd POs to mitigate muscle disuse atrophy. Wife stated she has pt complete Masako (5 reps) in the car as much as she can. SLP reiterated minimum/day is 20 reps (and Shaker is 6 reps/day). SLP stressed need for 20 reps minimum per day (except Shaker), and it will be best to cycle through ALL exercises. Pt performed appropriately with HEP after seeing SLP demo. He drank water today without overt s/sx oral or overt pharyngeal deficits.  01/20/24: Research states the risk for dysphagia increases due to radiation and/or chemotherapy treatment due to a variety of factors, so SLP educated the pt about the possibility of reduced/limited ability for PO intake during rad tx. SLP also educated pt regarding possible changes to swallowing musculature after rad tx, and why adherence to dysphagia HEP provided today and PO consumption was necessary to inhibit muscle fibrosis following rad tx and to mitigate muscle disuse atrophy. SLP informed pt why this would be detrimental to their swallowing status and to their pulmonary health. Pt demonstrated understanding of these things to SLP. SLP encouraged pt to safely eat and drink as deep into their radiation/chemotherapy as possible to provide the best possible long-term swallowing outcome for pt. SLP then developed an individualized HEP for pt involving oral and pharyngeal strengthening and ROM and pt was instructed how to perform these exercises, including SLP demonstration. After SLP demonstration, pt return demonstrated each exercise. SLP ensured pt performance was correct prior to educating pt on next exercise. Pt required occasional mod cues faded to modified independent to perform HEP. Wife was present and will assist pt with HEP PRN. Pt was instructed to complete this program 5-7 days/week, at least 20 reps a  day until 6 months after his or her last day of rad tx, and then x2 a week after that, indefinitely.   PATIENT EDUCATION: Education details: late effects head/neck radiation on swallow function, HEP procedure, and modification to HEP when difficulty experienced with swallowing during and after radiation course Person educated: Patient Education method: Explanation, Demonstration, Verbal cues, and Handouts Education comprehension: verbalized understanding, returned demonstration, verbal cues required, and needs further education   ASSESSMENT:  CLINICAL IMPRESSION: Patient is a 73 y.o. M who was seen today for treatment of swallowing as they undergo radiation/chemoradiation therapy. Today pt drank thin liquids without overt s/s oral or pharyngeal difficulty, but winced in pain when swallowing water. At this time pt swallowing is deemed WNL/WFL  with these POs; currently he is PEG dependent so SLP stressed continued need for POs during the day to decr prevalence of muscle disuse atrophy. There are no overt s/s aspiration PNA observed by SLP nor any reported by pt at this time. Data indicate that pt's swallow ability will likely decrease over the course of radiation/chemoradiation therapy and could very well decline over time following the conclusion of that therapy due to muscle disuse atrophy and/or muscle fibrosis. Pt will cont to need to be seen by SLP in order to assess safety of PO intake, assess the need for recommending any objective swallow assessment, and ensuring pt is correctly completing the individualized HEP.  OBJECTIVE IMPAIRMENTS: include memory and dysphagia. These impairments are limiting patient from safety when swallowing. Factors affecting potential to achieve goals and functional outcome are ability to learn/carryover information. Patient will benefit from skilled SLP services to address above impairments and improve overall function.  REHAB POTENTIAL: Good   GOALS: Goals  reviewed with patient?  Yes, in general SHORT TERM GOALS: Target: 3rd total session   1. Pt will compelte HEP with rare min A in 2 sessions Baseline: Goal status: INITIAL   2.  pt will tell SLP why pt is completing HEP with min A Baseline:  Goal status: INITIAL   3.  pt or wife will describe 3 overt s/s aspiration PNA with modified independence Baseline:  Goal status: INITIAL   4.  pt or wife will tell SLP how a food journal could hasten return to a more normalized diet Baseline:  Goal status: INITIAL     LONG TERM GOALS: Target: 7th total session   1.  pt will complete HEP with modified independence over two visits Baseline:  Goal status: INITIAL   2.  pt or wife will describe how to modify HEP over time, and the timeline associated with reduction in HEP frequency with modified independence over two sessions Baseline:  Goal status: INITIAL     PLAN:   SLP FREQUENCY:  once approx every 4 weeks   SLP DURATION:  7 sessions   PLANNED INTERVENTIONS: Aspiration precaution training, Pharyngeal strengthening exercises, Diet toleration management , Trials of upgraded texture/liquids, SLP instruction and feedback, Compensatory strategies, and Patient/family education, [phone] (treatment of swallowing dysfunction and/or oral function for feeding)   Kenlyn Lose, CCC-SLP 02/17/2024, 11:38 AM

## 2024-02-17 NOTE — Progress Notes (Signed)
 I was notified by head and neck navigator that patient's radiation treatment time has been changed.  She does not believe patient will show up for nutrition follow-up today.  Of note, patient is being followed by a dietitian at the [place].  Patient does not feel the need to see us  weekly.  He has our contact information for questions.

## 2024-02-18 ENCOUNTER — Ambulatory Visit
Admission: RE | Admit: 2024-02-18 | Discharge: 2024-02-18 | Disposition: A | Discharge status: Home / Self Care | Source: Ambulatory Visit | Attending: Radiation Oncology | Admitting: Radiation Oncology

## 2024-02-18 ENCOUNTER — Other Ambulatory Visit: Payer: Self-pay

## 2024-02-18 DIAGNOSIS — C140 Malignant neoplasm of pharynx, unspecified: Secondary | ICD-10-CM | POA: Diagnosis not present

## 2024-02-18 LAB — RAD ONC ARIA SESSION SUMMARY
Course Elapsed Days: 39
Plan Fractions Treated to Date: 30
Plan Prescribed Dose Per Fraction: 2 Gy
Plan Total Fractions Prescribed: 35
Plan Total Prescribed Dose: 70 Gy
Reference Point Dosage Given to Date: 60 Gy
Reference Point Session Dosage Given: 2 Gy
Session Number: 30

## 2024-02-21 ENCOUNTER — Ambulatory Visit
Admission: RE | Admit: 2024-02-21 | Discharge: 2024-02-21 | Disposition: A | Discharge status: Home / Self Care | Source: Ambulatory Visit | Attending: Radiation Oncology | Admitting: Radiation Oncology

## 2024-02-21 ENCOUNTER — Other Ambulatory Visit: Payer: Self-pay

## 2024-02-21 DIAGNOSIS — C140 Malignant neoplasm of pharynx, unspecified: Secondary | ICD-10-CM | POA: Diagnosis not present

## 2024-02-21 LAB — RAD ONC ARIA SESSION SUMMARY
Course Elapsed Days: 42
Plan Fractions Treated to Date: 31
Plan Prescribed Dose Per Fraction: 2 Gy
Plan Total Fractions Prescribed: 35
Plan Total Prescribed Dose: 70 Gy
Reference Point Dosage Given to Date: 62 Gy
Reference Point Session Dosage Given: 2 Gy
Session Number: 31

## 2024-02-22 ENCOUNTER — Ambulatory Visit
Admission: RE | Admit: 2024-02-22 | Discharge: 2024-02-22 | Disposition: A | Discharge status: Home / Self Care | Source: Ambulatory Visit | Attending: Radiation Oncology | Admitting: Radiation Oncology

## 2024-02-22 ENCOUNTER — Other Ambulatory Visit: Payer: Self-pay

## 2024-02-22 DIAGNOSIS — C140 Malignant neoplasm of pharynx, unspecified: Secondary | ICD-10-CM | POA: Diagnosis not present

## 2024-02-22 LAB — RAD ONC ARIA SESSION SUMMARY
Course Elapsed Days: 43
Plan Fractions Treated to Date: 32
Plan Prescribed Dose Per Fraction: 2 Gy
Plan Total Fractions Prescribed: 35
Plan Total Prescribed Dose: 70 Gy
Reference Point Dosage Given to Date: 64 Gy
Reference Point Session Dosage Given: 2 Gy
Session Number: 32

## 2024-02-23 ENCOUNTER — Other Ambulatory Visit: Payer: Self-pay

## 2024-02-23 ENCOUNTER — Ambulatory Visit
Admission: RE | Admit: 2024-02-23 | Discharge: 2024-02-23 | Disposition: A | Discharge status: Home / Self Care | Source: Ambulatory Visit | Attending: Radiation Oncology | Admitting: Radiation Oncology

## 2024-02-23 DIAGNOSIS — C140 Malignant neoplasm of pharynx, unspecified: Secondary | ICD-10-CM | POA: Diagnosis not present

## 2024-02-23 LAB — RAD ONC ARIA SESSION SUMMARY
Course Elapsed Days: 44
Plan Fractions Treated to Date: 33
Plan Prescribed Dose Per Fraction: 2 Gy
Plan Total Fractions Prescribed: 35
Plan Total Prescribed Dose: 70 Gy
Reference Point Dosage Given to Date: 66 Gy
Reference Point Session Dosage Given: 2 Gy
Session Number: 33

## 2024-02-24 ENCOUNTER — Ambulatory Visit
Admission: RE | Admit: 2024-02-24 | Discharge: 2024-02-24 | Disposition: A | Discharge status: Home / Self Care | Source: Ambulatory Visit | Attending: Radiation Oncology | Admitting: Radiation Oncology

## 2024-02-24 ENCOUNTER — Inpatient Hospital Stay: Admitting: Nutrition

## 2024-02-24 ENCOUNTER — Other Ambulatory Visit: Payer: Self-pay

## 2024-02-24 DIAGNOSIS — C140 Malignant neoplasm of pharynx, unspecified: Secondary | ICD-10-CM | POA: Diagnosis not present

## 2024-02-24 LAB — RAD ONC ARIA SESSION SUMMARY
Course Elapsed Days: 45
Plan Fractions Treated to Date: 34
Plan Prescribed Dose Per Fraction: 2 Gy
Plan Total Fractions Prescribed: 35
Plan Total Prescribed Dose: 70 Gy
Reference Point Dosage Given to Date: 68 Gy
Reference Point Session Dosage Given: 2 Gy
Session Number: 34

## 2024-02-24 NOTE — Progress Notes (Signed)
 Nutrition follow-up completed with patient and wife after radiation therapy for left tonsil.  He is followed by Dr. Lurena Sally and has a low-profile feeding tube.  He is followed by the [facility] dietitians.  Final radiation treatment is scheduled for Friday, May 23.  Weight: 105.2 pounds May 19 102.6 pounds May 12 106.8 pounds April 14.  No new labs.  Estimated nutrition needs: 1685-1927 cal, 65-85 g protein, greater than 1.9 L fluid.  Patient denies nausea and vomiting.  He has occasional constipation versus diarrhea.  His secretions are becoming thicker but he is using baking soda and salt water gargles to help thin secretions, which is helpful.  He is drinking some water, tea, and other liquids.  He sees the [place] dietitians for tube feeding management.  His tube feeding has been changed to TwoCal HN.  Wife reports he tolerates anywhere between 4-6 cartons daily.  She gives 90 mL of free water flushes 6 times a day. (540 mL free water) He is not eating any food at this time.  1 carton TwoCal HN provides 474 cal, 19.9 g protein, 166 mL free water. 4 cartons gives 1896 cal, 79.6 g protein, 664 mL free water. 5 cartons provides 2370 cal, 99.5 g protein, 830 mL free water. 6 cartons provides 2844 cal, 119.4 g protein, 996 mL free water.  Nutrition diagnosis: Unintended weight loss, improved.  Intervention: Tube feeding management per [employer] Educated about importance of adequate free water/fluids by mouth.  Encouraged 240 mL of free water via tube between feedings.  Monitoring, evaluation, goals: Tolerate adequate calories and protein to promote weight gain and healing.   No follow-up is scheduled at this time.  [facility] feeding questions or concerns.

## 2024-02-25 ENCOUNTER — Ambulatory Visit
Admission: RE | Admit: 2024-02-25 | Discharge: 2024-02-25 | Disposition: A | Discharge status: Home / Self Care | Source: Ambulatory Visit | Attending: Radiation Oncology | Admitting: Radiation Oncology

## 2024-02-25 ENCOUNTER — Other Ambulatory Visit: Payer: Self-pay

## 2024-02-25 DIAGNOSIS — C140 Malignant neoplasm of pharynx, unspecified: Secondary | ICD-10-CM | POA: Diagnosis not present

## 2024-02-25 LAB — RAD ONC ARIA SESSION SUMMARY
Course Elapsed Days: 46
Plan Fractions Treated to Date: 35
Plan Prescribed Dose Per Fraction: 2 Gy
Plan Total Fractions Prescribed: 35
Plan Total Prescribed Dose: 70 Gy
Reference Point Dosage Given to Date: 70 Gy
Reference Point Session Dosage Given: 2 Gy
Session Number: 35

## 2024-02-25 NOTE — Progress Notes (Signed)
 Oncology Nurse Navigator Documentation   Met with Julian Mejia and his wife after final RT to offer support and to celebrate end of radiation treatment.   Provided verbal post-RT guidance: Importance of keeping all follow-up appts, especially those with Nutrition and SLP. Importance of protecting treatment area from sun. Continuation of Sonafine application 2-3 times daily, application of antibiotic ointment to areas of raw skin; when supply of Sonafine exhausted transition to OTC lotion with vitamin E.  Explained my role as navigator will continue for several more months, encouraged him to call me with needs/concerns.    Lynetta Saran RN, BSN, OCN Head & Neck Oncology Nurse Navigator [facility] at [facility] Phone # [phone]  Fax # [phone]

## 2024-02-29 ENCOUNTER — Telehealth: Payer: Self-pay | Admitting: Radiation Oncology

## 2024-02-29 NOTE — Radiation Completion Notes (Signed)
 Patient Name: Julian Mejia, Julian Mejia MRN: [id number] Date of Birth: [birth year] Referring Physician: Dorathy Gals, M.D. Date of Service: 2024-02-29 Radiation Oncologist: Colie Dawes, M.D. 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, PT  03/21/2024 [facility]-RADIATION ONC FOLLOW UP 20 Pearlene Bouchard, [place]  03/21/2024 [facility] TREATMENT Schinke, Alethea Andes, CCC-SLP        ==========APPENDIX - ON TREATMENT VISIT NOTES==========   See weekly On Treatment Notes in Epic for details in the Media tab (listed as Progress notes on the On Treatment Visit Dates listed above).

## 2024-03-20 NOTE — Progress Notes (Signed)
 Radiation Oncology         [phone] ________________________________  Name: Julian Mejia MRN: [id number]  Date: 03/21/2024  DOB: 05/10/1951  Follow-Up Visit Note  CC: Hadassah Letters, MD  Guha, Bhuvana, MD  Diagnosis and Prior Radiotherapy:    No diagnosis found. 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 He completed his treatment on 03/20/2024.  ***                    ALLERGIES:  has no known allergies.  Meds: Current Outpatient Medications  Medication Sig Dispense Refill   acetaminophen  (TYLENOL ) 160 MG/5ML suspension Take 25 mLs (800 mg total) by mouth every 6 (six) hours as needed. May also take by PEG tube. 946 mL 3   albuterol  (PROVENTIL  HFA;VENTOLIN  HFA) 108 (90 Base) MCG/ACT inhaler Inhale 2 puffs into the lungs every 6 (six) hours as needed for wheezing or shortness of breath.      alendronate (FOSAMAX) 70 MG tablet Take 70 mg by mouth every Friday. Take with a full glass of water on an empty stomach.      amLODipine  (NORVASC ) 5 MG tablet Take 10 mg by mouth daily. (Patient not taking: Reported on 02/03/2024)     cholecalciferol (VITAMIN D ) 1000 units tablet Take 2,000 Units by mouth daily.  (Patient  not taking: Reported on 02/03/2024)     cyanocobalamin (VITAMIN B12) 500 MCG tablet Take 500 mcg by mouth daily. (Patient not taking: Reported on 02/03/2024)     donepezil  (ARICEPT ) 5 MG tablet Take 10 mg by mouth daily. (Patient not taking: Reported on 12/22/2023)     ibuprofen (ADVIL) 200 MG tablet Take 400 mg by mouth every 8 (eight) hours as needed for headache.     lidocaine  (XYLOCAINE ) 2 % solution Patient: Mix 1part 2% viscous lidocaine , 1part H20. Swish & swallow 10mL of diluted mixture, 30min before meals and at bedtime, up to QID 200 mL 3   losartan  (COZAAR ) 50 MG tablet Take 50 mg by mouth daily. (Patient not taking: Reported on 02/03/2024)     metoprolol  (LOPRESSOR ) 50 MG tablet Take 50 mg by mouth daily. Hold if BP is less than 90/60     oxyCODONE  (ROXICODONE ) 5 MG/5ML solution Take 5 mLs (5 mg total) by mouth every 6 (six) hours as needed for severe pain (pain score 7-10). May also take by PEG tube. 473 mL 0   tiotropium (SPIRIVA ) 18 MCG inhalation capsule Place 18 mcg into inhaler and inhale daily.     No current facility-administered medications for this visit.    Physical Findings: The patient is in no acute distress. Patient is alert and oriented. Wt Readings from Last 3 Encounters:  01/12/24 107 lb 6.4 oz (48.7 kg)  01/05/24 109 lb 2 oz (49.5 kg)  12/22/23 101 lb 12.8 oz (46.2 kg)    vitals were not taken for this visit. .  General: Alert and oriented, in no acute distress HEENT: Head is normocephalic. Extraocular movements are intact. Oropharynx is notable for *** Neck: Neck is notable for *** Skin: Skin in treatment fields shows satisfactory healing *** Heart: Regular in rate and rhythm with no murmurs, rubs, or gallops. Chest: Clear to auscultation bilaterally, with no rhonchi, wheezes, or rales. Abdomen: Soft, nontender, nondistended, with no rigidity or guarding. Extremities: No cyanosis or edema. Lymphatics: see Neck Exam Psychiatric: Judgment and insight are intact.  Affect is appropriate.   Lab Findings: Lab Results  Component Value Date   WBC 5.2 01/12/2024   HGB 10.7 (L) 01/12/2024   HCT 31.7 (L) 01/12/2024   MCV 91.9 01/12/2024   PLT 274 01/12/2024    Lab Results  Component Value Date   TSH 1.775 01/12/2024    Radiographic Findings: No results found.  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 ***  6) Thyroid  function:  Lab Results  Component Value Date   TSH 1.775 01/12/2024    7) Other: ***  8) PET in 2.5 months with an office visit to follow. The patient was encouraged to call with any issues or questions before then.  On date of service, in total, I spent *** minutes on this encounter. Patient was seen in person. _____________________________________    Julio Ohm, PA-C

## 2024-03-20 NOTE — Progress Notes (Signed)
 Patient identity verified x2  Javin Nong is here for a follow-up appointment today to see Julio Ohm PA-C.  Treatment Completion Date: 02/25/2024 Pain issues, if any: Denies Using a feeding tube?: Denies Weight changes, if any:   03/21/24  106.0 lb 02/21/24  01/12/24 - 107.0 lbs Swallowing issues, if any: She reports that his throat is still sore. Smoking or chewing tobacco? Denies Using fluoride toothpaste daily? He uses salt and soda to brush gums. Last ENT visit was on: 03/17/24 Other notable issues, if: None at this time.  BP 114/76 (BP Location: Left Arm, Patient Position: Sitting)   Pulse 95   Temp 97.7 F (36.5 C) (Temporal)   Resp 18   Ht 5' 11 (1.803 m)   Wt 106 lb (48.1 kg)   SpO2 94%   BMI 14.78 kg/m

## 2024-03-21 ENCOUNTER — Ambulatory Visit
Admission: RE | Admit: 2024-03-21 | Discharge: 2024-03-21 | Disposition: A | Discharge status: Home / Self Care | Source: Ambulatory Visit | Attending: Radiology | Admitting: Radiology

## 2024-03-21 ENCOUNTER — Encounter: Payer: Self-pay | Admitting: Radiology

## 2024-03-21 ENCOUNTER — Telehealth: Payer: Self-pay | Admitting: Radiation Oncology

## 2024-03-21 ENCOUNTER — Ambulatory Visit: Attending: Radiation Oncology

## 2024-03-21 VITALS — BP 114/76 | HR 95 | Temp 97.7°F | Resp 18 | Ht 71.0 in | Wt 106.0 lb

## 2024-03-21 DIAGNOSIS — C109 Malignant neoplasm of oropharynx, unspecified: Secondary | ICD-10-CM

## 2024-03-21 DIAGNOSIS — C102 Malignant neoplasm of lateral wall of oropharynx: Secondary | ICD-10-CM | POA: Insufficient documentation

## 2024-03-21 DIAGNOSIS — Z87891 Personal history of nicotine dependence: Secondary | ICD-10-CM | POA: Insufficient documentation

## 2024-03-21 DIAGNOSIS — Z79899 Other long term (current) drug therapy: Secondary | ICD-10-CM | POA: Diagnosis not present

## 2024-03-21 DIAGNOSIS — Z923 Personal history of irradiation: Secondary | ICD-10-CM | POA: Diagnosis not present

## 2024-03-21 DIAGNOSIS — R1310 Dysphagia, unspecified: Secondary | ICD-10-CM | POA: Insufficient documentation

## 2024-03-21 NOTE — Patient Instructions (Signed)
   Signs of Aspiration Pneumonia   Chest pain/tightness Fever (can be low grade) Cough  With foul-smelling phlegm (sputum) With sputum containing pus or blood With greenish sputum Fatigue  Shortness of breath  Wheezing   **IF YOU HAVE THESE SIGNS, CONTACT YOUR DOCTOR OR GO TO THE EMERGENCY DEPARTMENT OR URGENT CARE AS SOON AS POSSIBLE**

## 2024-03-21 NOTE — Therapy (Signed)
 OUTPATIENT SPEECH LANGUAGE PATHOLOGY ONCOLOGY TREATMENT   Patient Name: Julian Mejia MRN: [id number] DOB:October 04, 1951, 73 y.o., male Today's Date: 03/21/2024  PCP: Hadassah Letters, MD REFERRING PROVIDER: Dorathy Gals, MD  END OF SESSION:  End of Session - 03/21/24 1026     Visit Number 3    Number of Visits 7    Date for SLP Re-Evaluation 04/19/24    SLP Start Time 1021    Activity Tolerance Patient tolerated treatment well           Past Medical History:  Diagnosis Date   COPD (chronic obstructive pulmonary disease) (HCC)    Hypertension    Past Surgical History:  Procedure Laterality Date   HERNIA REPAIR     REPAIR OF PERFORATED ULCER     TOTAL HIP ARTHROPLASTY Right 05/08/2016   Procedure: TOTAL HIP ARTHROPLASTY ANTERIOR APPROACH;  Surgeon: Wes Hamman, MD;  Location: [facility] OR;  Service: Orthopedics;  Laterality: Right;   Patient Active Problem List   Diagnosis Date Noted   Inadequate nutrition 01/13/2024   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 He presented to Dr. Dorathy Gals [facility]) with complaints of a sore throat and unintentional weight loss. 12/02/24 CT neck showing an asymmetric and suspicious soft tissue along  the left lateral wall of the pharynx, most pronounced at the level of the uvula and extending inferiorly to the left hypopharynx. Scan also indicated a a small right level 2A lymph node is abnormally heterogeneous, measuring 6-7 mm at the greatest extend and a slightly larger heterogeneous right level 2 B lymph node. CT chest done then demonstrated no acute process or explanation for weight loss and no evidence of metastatic disease.  12/09/23 He saw Dr. Lexine Redder at [facility] who performed a flexible fiberoptic laryngoscopy showing a left pharyngeal lesion extending from the soft palate to the hypopharynx. The posterior soft palate, uvula, tongue base, epiglottis, aryepiglottic folds, hypopharynx, supraglottis, glottis and vallecula were visualized and appeared healthy without mucosal masses or lesions. 12/09/23 Biopsy of left tonsil revealed moderately differentiated keratinizing SCC. 12/10/23 PET revealed a hypermetabolic oropharyngeal mass measuring 1.3 x 1.7 cm with involvement of the deep/lateral right fossa of Rosenmller, left nasopharyngeal soft tissues and bilateral level II cervical lymph nodes. 12/22/23 consult with Dr. Lurena Sally. He also consulted with medical oncology at the [facility] and was eventually referred here for consult with Dr. Randye Buttner on 01/12/24. He started radiation and may receive chemotherapy. Treatment plan:  He will receive 35 fractions of radiation to his Pharynx and bilateral neck which started on 01/10/24,and will end on 02/25/24.  Pretreatment procedures:12/27/23 Full mouth dental extractions, 12/29/23 PEG placed  PAIN:  Are you having pain? No  FALLS: Has patient fallen in last 6 months?  No  PATIENT GOALS: Maintain WNL swallowing  OBJECTIVE:  Note: Objective measures were completed at Evaluation unless otherwise noted. DIAGNOSTIC FINDINGS: see pertinent history                                                                                                                            TREATMENT  DATE:   03/21/24: Pt cont in bed at home, other than appointments. SLP encouraged pt to arise and do some physical exercise. He is not completing HEP. SLP re-educated pt (and wife) on the importance of completion of HEP. Pt was unable to tell SLP rationale later in session and req'd total A. SLP demonstrated each exercise (except Shaker) to pt and wife today and pt was mod I/independent prior to SLP moving on to demo next exercise. SLP stressed pt should adhere to HEP to limit/eliminate the risk of muscle fibrosis and/or atrophy. SLP educated pt and wife on benefits of food journal and they demonstrated understanding. SLP also shared overt s/sx of aspiration PNA and understanding was demonstrated. Possible to reduce frequency to every 8 weeks after next session.  02/17/24: Pt is in bed at home, other than POs, or coming to [facility]. SLP stressed need for cont'd POs to mitigate muscle disuse atrophy. Wife stated she has pt complete Masako (5 reps) in the car as much as she can. SLP reiterated minimum/day is 20 reps (and Shaker is 6 reps/day). SLP stressed need for 20 reps minimum per day (except Shaker), and it will be best to cycle through ALL exercises. Pt performed appropriately with HEP after seeing SLP demo. He drank water today without overt s/sx oral or overt pharyngeal deficits.  01/20/24: Research states the risk for dysphagia increases due to radiation and/or chemotherapy treatment due to a variety of factors, so SLP educated the pt about the possibility of reduced/limited ability for PO intake during rad tx. SLP also educated pt regarding possible changes to swallowing musculature after rad tx, and why adherence to dysphagia HEP provided today and PO consumption was necessary to inhibit muscle fibrosis following rad tx and to mitigate muscle disuse atrophy. SLP informed pt why this would be detrimental to their swallowing status and to their pulmonary health. Pt demonstrated understanding of these things  to SLP. SLP encouraged pt to safely eat and drink as deep into their radiation/chemotherapy as possible to provide the best possible long-term swallowing outcome for pt. SLP then developed an individualized HEP for pt involving oral and pharyngeal strengthening and ROM and pt was instructed how to perform these exercises, including SLP demonstration. After SLP demonstration, pt return demonstrated each exercise. SLP ensured pt performance was correct prior to educating pt on next exercise. Pt required occasional mod cues faded to modified independent to perform HEP. Wife was present and will assist pt with HEP PRN. Pt was instructed to complete this program 5-7 days/week, at least 20 reps a day until 6 months after his or her last day of rad tx, and then x2 a week  after that, indefinitely.   PATIENT EDUCATION: Education details: late effects head/neck radiation on swallow function and HEP procedure Person educated: Patient and Spouse Education method: Explanation, Demonstration, Verbal cues, and Handouts Education comprehension: verbalized understanding, returned demonstration, verbal cues required, and needs further education   ASSESSMENT:  CLINICAL IMPRESSION: Patient is a 73 y.o. M who was seen today for treatment of swallowing as after undergoing radiation/chemoradiation therapy. He did not meet some STGs surrounding rationale for HEP and HEP procedure. Today pt drank thin liquids without overt s/s oral or pharyngeal difficulty, but mildly winced in pain when swallowing water. At this time pt swallowing is deemed WNL/WFL with these POs; currently he is PEG dependent so SLP stressed pt beginning to try some soft and mushy foods in the next 7 days. There are no overt s/s aspiration PNA observed by SLP nor any reported by pt at this time. Data indicate that pt's swallow ability could very well decline over time following the conclusion of rad therapy due to muscle disuse atrophy and/or muscle fibrosis.  Pt will cont to need to be seen by SLP in order to assess safety of PO intake, assess the need for recommending any objective swallow assessment, and ensuring pt is correctly completing the individualized HEP.  OBJECTIVE IMPAIRMENTS: include memory and dysphagia. These impairments are limiting patient from safety when swallowing. Factors affecting potential to achieve goals and functional outcome are ability to learn/carryover information. Patient will benefit from skilled SLP services to address above impairments and improve overall function.  REHAB POTENTIAL: Good   GOALS: Goals reviewed with patient?  Yes, in general SHORT TERM GOALS: Target: 3rd total session   1. Pt will compelte HEP with rare min A in 2 sessions Baseline: Goal status: not met   2.  pt will tell SLP why pt is completing HEP with min A Baseline:  Goal status: not met   3.  pt or wife will describe 3 overt s/s aspiration PNA with modified independence Baseline:  Goal status: met   4.  pt or wife will tell SLP how a food journal could hasten return to a more normalized diet Baseline:  Goal status: met     LONG TERM GOALS: Target: 7th total session   1.  pt will complete HEP with modified independence over two visits Baseline:  Goal status: INITIAL   2.  pt or wife will describe how to modify HEP over time, and the timeline associated with reduction in HEP frequency with modified independence over two sessions Baseline:  Goal status: INITIAL     PLAN:   SLP FREQUENCY:  once approx every 4 weeks   SLP DURATION:  7 sessions   PLANNED INTERVENTIONS: Aspiration precaution training, Pharyngeal strengthening exercises, Diet toleration management , Trials of upgraded texture/liquids, SLP instruction and feedback, Compensatory strategies, and Patient/family education, [phone] (treatment of swallowing dysfunction and/or oral function for feeding)   Jaasia Viglione, CCC-SLP 03/21/2024, 10:31 AM

## 2024-03-21 NOTE — Telephone Encounter (Signed)
 6/17 [facility]pleted [facility] to [facility] box.

## 2024-03-28 ENCOUNTER — Ambulatory Visit: Payer: Self-pay | Attending: Radiation Oncology | Admitting: Physical Therapy

## 2024-03-28 ENCOUNTER — Encounter: Payer: Self-pay | Admitting: Physical Therapy

## 2024-03-28 DIAGNOSIS — R293 Abnormal posture: Secondary | ICD-10-CM | POA: Diagnosis present

## 2024-03-28 DIAGNOSIS — C109 Malignant neoplasm of oropharynx, unspecified: Secondary | ICD-10-CM | POA: Insufficient documentation

## 2024-03-28 NOTE — Therapy (Signed)
 OUTPATIENT PHYSICAL THERAPY HEAD AND NECK POST RADIATION FOLLOW UP   Patient Name: Julian Mejia MRN: [id number] DOB:06/16/1951, 73 y.o., male Today's Date: 03/28/2024  END OF SESSION:  PT End of Session - 03/28/24 1430     Visit Number 2    Number of Visits 2    Date for PT Re-Evaluation 03/30/24    PT Start Time 1404    PT Stop Time 1425    PT Time Calculation (min) 21 min    Activity Tolerance Patient tolerated treatment well    Behavior During Therapy WFL for tasks assessed/performed          Past Medical History:  Diagnosis Date   COPD (chronic obstructive pulmonary disease) (HCC)    Hypertension    Past Surgical History:  Procedure Laterality Date   HERNIA REPAIR     REPAIR OF PERFORATED ULCER     TOTAL HIP ARTHROPLASTY Right 05/08/2016   Procedure: TOTAL HIP ARTHROPLASTY ANTERIOR APPROACH;  Surgeon: [name] Cummins, MD;  Location: [facility] OR;  Service: Orthopedics;  Laterality: Right;   Patient Active Problem List   Diagnosis Date Noted   Inadequate nutrition 01/13/2024   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   Memory loss 05/08/2016   Hyponatremia 05/08/2016   Closed subcapital fracture of neck of right femur (HCC) 05/07/2016   Essential hypertension 05/07/2016   Chronic obstructive pulmonary disease (COPD) (HCC) 05/07/2016    PCP: Gael Reams, MD   REFERRING PROVIDER: Lauraine Golden, MD   REFERRING DIAG: C10.9 (ICD-10-CM) - Squamous cell carcinoma of oropharynx (HCC)   THERAPY DIAG:  Abnormal posture  Oropharyngeal carcinoma (HCC)  Rationale for Evaluation and Treatment: Rehabilitation  ONSET DATE: 12/09/23  SUBJECTIVE:                                                                                                                                                                                           SUBJECTIVE STATEMENT: Pt denies any swelling or pain in his neck. Pt only gets up to eat. The last 2 days he was able to stay up about 1.5  hours a day.   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 Robbi Cadet [facility]) with complaints of a sore throat and unintentional weight loss. 12/02/24 CT neck showing an asymmetric and suspicious soft tissue along the left lateral wall of the pharynx, most pronounced at the level of the uvula and extending inferiorly to the left hypopharynx. Scan also indicated a a small right level 2A lymph node is abnormally heterogeneous, measuring  6-7 mm at the greatest extend and a slightly larger heterogeneous right level 2 B lymph node. CT chest done then demonstrated no acute process or explanation for weight loss and no evidence of metastatic disease.  12/09/23 He saw Dr. [name] at [facility] who performed a flexible fiberoptic laryngoscopy showing a left pharyngeal lesion extending from the soft palate to the hypopharynx. The posterior soft palate, uvula, tongue base, epiglottis, aryepiglottic folds, hypopharynx, supraglottis, glottis and vallecula were visualized and appeared healthy without mucosal masses or lesions. 12/09/23 Biopsy of left tonsil revealed moderately differentiated keratinizing SCC. 12/10/23 PET revealed a hypermetabolic oropharyngeal mass measuring 1.3 x 1.7 cm with involvement of the deep/lateral right fossa of Rosenmller, left nasopharyngeal soft tissues and bilateral level II cervical lymph nodes. 12/22/23 consult with Dr. [name]. He also consulted with medical oncology at the [facility] and was eventually referred here for consult with Dr. Autumn on 01/12/24. He started radiation and may receive chemotherapy. He will receive 35 fractions of radiation to his Pharynx and bilateral neck which started on 01/10/24 and will complete on 02/25/24.  He will possibly start chemotherapy on 01/27/24 but will discuss with Dr. Autumn 01/26/24. COPD   PATIENT GOALS:  Reassess how my recovery is going related to neck ROM, cervical pain,  fatigue, and swelling.  PAIN:  Are you having pain? No  PRECAUTIONS: Recent radiation, Head and neck lymphedema risk, R hip replacement   OBJECTIVE:   POSTURE:  Forward head and rounded shoulders posture   30 SEC SIT TO STAND: 03/28/24- Pt able to stand 1x without use of UEs At eval on 02/03/24- Unable - pt able to stand from chair 1x without use of hands but had increased shortness of breath after this   SHOULDER AROM:   WFL functional but not full ROM     CERVICAL AROM: At eval: somewhat difficult to assess as pt was leaning forward in his wheelchair      Percent limited on 03/28/24  Flexion  WFL  Extension  WFL  Right lateral flexion  WFL  Left lateral flexion  WFL  Right rotation  WFL  Left rotation  WFL                          (Blank rows=not tested)  LYMPHEDEMA ASSESSMENT:    Circumference in cm  4 cm superior to sternal notch around neck 33.8  6 cm superior to sternal notch around neck 34.2  8 cm superior to sternal notch around neck 33.5  R lateral nostril from base of nose to medial tragus   L lateral nostril from base of nose to medial tragus   R corner of mouth to where ear lobe meets face   L corner of mouth to where ear lobe meets face         (Blank rows=not tested)  CURRENT/PAST TREATMENTS:  Chemotherapy: completed  Radiation: completed  OTHER SYMPTOMS: Pain No Fibrosis No Pitting edema No Infections No Decreased scar mobility No  PATIENT EDUCATION:  Education details: importance of increasing activity level a little every day to decrease fatigue, importance of HEP to decrease/prevent fibrosis and loss of ROM, signs of lymphedema, importance of posture Person educated: Patient and Spouse Education method: Explanation and Handouts Education comprehension: verbalized understanding  HOME EXERCISE PROGRAM: Reviewed previously given post op HEP.   ASSESSMENT:  CLINICAL IMPRESSION: Pt returns to PT after completing radiation and  chemotherapy for treatment of L tonsillar cancer. Pt's wife  states he has been in bed most of the time but in the past two days he sat up for about an hour or so each day. His activity was limited at baseline by severe COPD. Pt has done his neck ROM exercises sporadically per his wife. His neck ROM is WFL. He does not show any signs of lymphedema at this point. Educated pt and spouse extensively about importance of increasing activity level to help decrease fatigue and importance of compliance with an HEP to help prevent loss of ROM and fibrosis.   Pt will benefit from skilled therapeutic intervention to improve on the following deficits: postural dysfunction  PT treatment/interventions: ADL/Self care home management, [phone]- PT Re-evaluation, 97110-Therapeutic exercises, 97530- Therapeutic activity, [id number]- Neuromuscular re-education, 97535- Self Care, and [id number]- Manual therapy     GOALS: Goals reviewed with patient? Yes  LONG TERM GOALS:  (STG=LTG)  GOALS Name Target Date  Goal status  1 Pt will demonstrate a return to baseline cervical ROM measurements and not demonstrate any signs or symptoms of lymphedema. Baseline: 03/28/24 MET       PLAN:  PT FREQUENCY/DURATION: d/c this session  PLAN FOR NEXT SESSION: d/c this session   [facility]  [address], [address]  [place] [address]  [phone]   Scar massage You can begin gentle scar massage to you incision sites. Gently place one hand on the incision and move the skin (without sliding on the skin) in various directions. Do this for a few minutes and then you can gently massage either coconut oil or vitamin E cream into the scars.  Home exercise Program Continue doing the exercises you were given until you feel like you can do them without feeling any tightness at the end. It is best to do them for several months after completion of radiation since the effects of radiation continue past completion.    Walking Program Studies show that 30 minutes of walking per day (fast enough to elevate your heart rate) can significantly reduce the risk of a cancer recurrence. If you can't walk due to other medical reasons, we encourage you to find another activity you could do (like a stationary bike or water exercise).  Posture After treatment for head and neck cancer, people frequently sit with rounded shoulders and forward head posture because the front of the neck has become tight and it feels better. If you sit like this, you can become very tight and have pain in sitting or standing with good posture. Try to be aware of your posture and sit and stand up tall to heal properly.  Follow up PT: Please let you doctor know as soon as possible if you develop any swelling in your face or neck in the future. Lymphedema (swelling) can occur months after completion of radiation. The sooner you can let the doctor know, the sooner they can refer you back to PT. It is much easier to treat the swelling early on.   Florina Sever [place], PT 03/28/2024, 2:40 PM  PHYSICAL THERAPY DISCHARGE SUMMARY  Visits from Start of Care: 2  Current functional level related to goals / functional outcomes: All goals met   Remaining deficits: None   Education / Equipment: HEP, lymphedema, posture education, walking program   Patient agrees to discharge. Patient goals were met. Patient is being discharged due to meeting the stated rehab goals.  Adriyanna Christians [place], [place] 03/28/24 2:40 PM

## 2024-04-03 NOTE — Progress Notes (Signed)
-------------------------------------------------------------------------------   Attestation signed by Delon Anette Kipper, DMD at 04/04/2024  9:05 AM I reviewed the case and agree with the recommendations presented. Risks of denture fabrication and wear s/p radiation to the head and neck were discussed with the patient and his wife. They understand that non-healing denture sores may progress to osteonecrosis in patients that have undergone radiation to the head and neck. If they wish to proceed with denture fabrication and wear despite the risks, they are aware that frequent follow-up with their dentist is warranted to evaluate for any early signs of denture sores or osteonecrosis.  -------------------------------------------------------------------------------  Final Dental Evaluation - Post radiation   Patient: Julian Mejia MRN: [id number]   Attending: Delon Kipper, DMD Assistant: Marit Countess, CDA  Subjective:  Julian Lemond Parkin, a 73 y.o. male  was seen by dentistry on 04/03/2024 for a dental re-evaluation . Patient reports no CC, here for final check.    Julian Mejia finished his radiation on 02/25/2024.     Gtube?: Yes Nutrition:Patient is primarily using a G-tube for nutrition, with occasional sips of water by mouth (P.O). The patient is still experiencing swallowing difficulties and is following up with a Public relations account executive (SLP). Additionally, the patient still cannot taste.   Using fluoride?: no, patient is ednetulals    Home oral hygiene: no existing home care routine   Objective: Mucositis: none Moderate dry mouth   Assessment: Dx: stage IV SCC of left tonsil   Oral home care: fair  Plan: - Per the patient's wife, they are pursuing care with a dentist at the [facility]. After completing all follow-up appointments, they will establish care and fabricate complete dentures (CD) for both the maxillary (upper) and mandibular (lower) arches. The patient is  not currently wearing their existing maxillary denture   - Discussed with the patient and their wife that due to the lack of saliva and a history of high doses of radiation to the head and neck, it is very likely that non-healing denture sores may develop. It is important to obtain consent and ensure follow-up with a dentist after the fabrication of complete dentures (CD)   Lauraine Holler, DDS

## 2024-04-17 ENCOUNTER — Telehealth: Payer: Self-pay | Admitting: *Deleted

## 2024-04-17 NOTE — Telephone Encounter (Signed)
 CALLED PATIENT TO INFORM OF PET SCAN FOR 05-29-24- ARRIVAL TIME- 10:30 AM @ [facility], PATIENT TO HAVE WATER ONLY 6 HRS. PRIOR TO SCAN, PATIENT TO RECEIVE RESULTS FROM ELLIE MUIR ON 06-06-24 @ 11 AM, SPOKE WITH PATIENT'S WIFE AND SHE IS AWARE OF THESE APPTS. AND THE INSTRUCTIONS

## 2024-04-19 ENCOUNTER — Ambulatory Visit: Attending: Radiation Oncology

## 2024-04-19 DIAGNOSIS — R1310 Dysphagia, unspecified: Secondary | ICD-10-CM | POA: Diagnosis present

## 2024-04-19 NOTE — Addendum Note (Signed)
 Addended by: JACELYN PITTS B on: 04/19/2024 02:00 PM   Modules accepted: Orders

## 2024-04-19 NOTE — Therapy (Addendum)
 OUTPATIENT SPEECH LANGUAGE PATHOLOGY ONCOLOGY TREATMENT   Patient Name: Julian Mejia MRN: [id number] DOB:August 26, 1951, 73 y.o., male Today's Date: 04/19/2024  PCP: [name], MD REFERRING PROVIDER: [name], MD  END OF SESSION:  End of Session - 04/19/24 1345     Visit Number 4    Number of Visits 7    Date for SLP Re-Evaluation 06/13/24    SLP Start Time 1318    SLP Stop Time  1345    SLP Time Calculation (min) 27 min    Activity Tolerance Patient tolerated treatment well            Past Medical History:  Diagnosis Date   COPD (chronic obstructive pulmonary disease) (HCC)    Hypertension    Past Surgical History:  Procedure Laterality Date   HERNIA REPAIR     REPAIR OF PERFORATED ULCER     TOTAL HIP ARTHROPLASTY Right 05/08/2016   Procedure: TOTAL HIP ARTHROPLASTY ANTERIOR APPROACH;  Surgeon: [name] Cummins, MD;  Location: [facility] OR;  Service: Orthopedics;  Laterality: Right;   Patient Active Problem List   Diagnosis Date Noted   Inadequate nutrition 01/13/2024   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  Objective: Pt has not met many goals due to not adhering to recommended HEP completion, and is reticent to have any POs.   Goal Update: See below  Plan: See below. Pt will likely be d/c'd in 1-2 visits.  Reason Skilled Services are Required: SLP would like to verify progress over time.    ONSET DATE: See pertinent history below   REFERRING DIAG:  C14.8 (ICD-10-CM) - Malignant neoplasm of overlapping sites of lip, oral cavity and pharynx       THERAPY DIAG:  Dysphagia, unspecified type - Plan: SLP plan of care cert/re-cert  Rationale for Evaluation and Treatment: Rehabilitation  SUBJECTIVE:   SUBJECTIVE STATEMENT: Pt is having water regularly but remains PEG dependent.  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 He presented to Dr. [name] [facility]) with complaints of a sore throat and unintentional weight loss. 12/02/24 CT neck showing an asymmetric and suspicious soft tissue along the left lateral wall of the pharynx, most pronounced at the level of the uvula and extending inferiorly to the left hypopharynx. Scan also indicated a a small right level 2A lymph node is abnormally heterogeneous, measuring 6-7 mm at the greatest extend and a slightly larger heterogeneous right level 2 B lymph node. CT chest done then demonstrated no acute process or explanation for weight loss and no evidence of metastatic disease.  12/09/23 He saw Dr. [name] at [facility] who performed a flexible fiberoptic laryngoscopy showing a left pharyngeal lesion extending from the soft palate to the hypopharynx. The posterior soft palate, uvula, tongue base, epiglottis, aryepiglottic folds, hypopharynx, supraglottis, glottis and vallecula were visualized and appeared healthy without mucosal masses or lesions. 12/09/23 Biopsy of left tonsil revealed moderately differentiated keratinizing SCC. 12/10/23 PET revealed a hypermetabolic oropharyngeal mass measuring 1.3 x 1.7 cm with involvement  of the deep/lateral right fossa of Rosenmller, left nasopharyngeal soft tissues and bilateral level II cervical lymph nodes. 12/22/23 consult with Dr. [name]. He also consulted with medical oncology at the [facility] and was eventually referred here for consult with Dr. Autumn on 01/12/24. He started radiation and may receive chemotherapy. Treatment plan:  He will receive 35 fractions  of radiation to his Pharynx and bilateral neck which started on 01/10/24,and will end on 02/25/24.  Pretreatment procedures:12/27/23 Full mouth dental extractions, 12/29/23 PEG placed  PAIN:  Are you having pain? No  FALLS: Has patient fallen in last 6 months?  No  PATIENT GOALS: Maintain WNL swallowing  OBJECTIVE:  Note: Objective measures were completed at Evaluation unless otherwise noted. DIAGNOSTIC FINDINGS: see pertinent history                                                                                                                            TREATMENT DATE:   04/19/24: Pt is drinking water - approx 12 oz/day, and having a few bites of pureed each day, mostly due to ageusia/dysgeusia, per wife. SLP gave rationale for incr'd PO intake and suggested pt ingest twice the water and having 4-5 teaspoons of soft/mushy food and/or puree 8-10 times a day. SLP ensured pt and wife understood rationale.  With HEP, pt req'd model (except for Shaker), but was able to complete reps of effortful, Masako, and Mendelsohn. SLP told pt and wife rationale for HEP and ensured pt understood. Pt and wife agreed pt could return in >4 weeks. Next visit 06/12/24.  03/21/24: Pt cont in bed at home, other than appointments. SLP encouraged pt to arise and do some physical exercise. He is not completing HEP. SLP re-educated pt (and wife) on the importance of completion of HEP. Pt was unable to tell SLP rationale later in session and req'd total A. SLP demonstrated each exercise (except Shaker) to pt and wife today and pt was mod I/independent prior to SLP moving on to demo next exercise. SLP stressed pt should adhere to HEP to limit/eliminate the risk of muscle fibrosis and/or atrophy. SLP educated pt and wife on benefits of food journal and they demonstrated understanding. SLP also shared overt s/sx of aspiration PNA and understanding was demonstrated. Possible to reduce frequency to every 8 weeks after next  session.  02/17/24: Pt is in bed at home, other than POs, or coming to [facility]. SLP stressed need for cont'd POs to mitigate muscle disuse atrophy. Wife stated she has pt complete Masako (5 reps) in the car as much as she can. SLP reiterated minimum/day is 20 reps (and Shaker is 6 reps/day). SLP stressed need for 20 reps minimum per day (except Shaker), and it will be best to cycle through ALL exercises. Pt performed appropriately with HEP after seeing SLP demo. He drank water today without overt s/sx oral or overt pharyngeal deficits.  01/20/24: Research states the risk for dysphagia increases due to radiation and/or chemotherapy  treatment due to a variety of factors, so SLP educated the pt about the possibility of reduced/limited ability for PO intake during rad tx. SLP also educated pt regarding possible changes to swallowing musculature after rad tx, and why adherence to dysphagia HEP provided today and PO consumption was necessary to inhibit muscle fibrosis following rad tx and to mitigate muscle disuse atrophy. SLP informed pt why this would be detrimental to their swallowing status and to their pulmonary health. Pt demonstrated understanding of these things to SLP. SLP encouraged pt to safely eat and drink as deep into their radiation/chemotherapy as possible to provide the best possible long-term swallowing outcome for pt. SLP then developed an individualized HEP for pt involving oral and pharyngeal strengthening and ROM and pt was instructed how to perform these exercises, including SLP demonstration. After SLP demonstration, pt return demonstrated each exercise. SLP ensured pt performance was correct prior to educating pt on next exercise. Pt required occasional mod cues faded to modified independent to perform HEP. Wife was present and will assist pt with HEP PRN. Pt was instructed to complete this program 5-7 days/week, at least 20 reps a day until 6 months after his or her last day of rad tx, and then  x2 a week after that, indefinitely.   PATIENT EDUCATION: Education details: late effects head/neck radiation on swallow function and HEP procedure Person educated: Patient and Spouse Education method: Explanation, Demonstration, Verbal cues, and Handouts Education comprehension: verbalized understanding, returned demonstration, verbal cues required, and needs further education   ASSESSMENT:  CLINICAL IMPRESSION: Patient is a 73 y.o. M who was seen today for treatment of swallowing as after undergoing radiation/chemoradiation therapy. Today pt drank thin liquids and ate applesauce without overt s/s oral or pharyngeal difficulty. At this time pt swallowing is deemed WNL/WFL with these POs; currently he is PEG dependent so SLP stressed pt have incr'd amounts of soft and mushy foods and more liquids, daily. There are no overt s/s aspiration PNA observed by SLP nor any reported by pt at this time. Data indicate that pt's swallow ability could very well decline over time following the conclusion of rad therapy due to muscle disuse atrophy and/or muscle fibrosis. Pt will cont to need to be seen by SLP in order to assess safety of PO intake, assess the need for recommending any objective swallow assessment, and ensuring pt is correctly completing the individualized HEP.  OBJECTIVE IMPAIRMENTS: include memory and dysphagia. These impairments are limiting patient from safety when swallowing. Factors affecting potential to achieve goals and functional outcome are ability to learn/carryover information. Patient will benefit from skilled SLP services to address above impairments and improve overall function.  REHAB POTENTIAL: Good   GOALS: Goals reviewed with patient?  Yes, in general SHORT TERM GOALS: Target: 3rd total session   1. Pt will compelte HEP with rare min A in 2 sessions Baseline: Goal status: not met   2.  pt will tell SLP why pt is completing HEP with min A Baseline:  Goal status: not  met   3.  pt or wife will describe 3 overt s/s aspiration PNA with modified independence Baseline:  Goal status: met   4.  pt or wife will tell SLP how a food journal could hasten return to a more normalized diet Baseline:  Goal status: met     LONG TERM GOALS: Target: 7th total session   1.  pt will complete HEP with modified independence over two visits Baseline:  Goal status:  INITIAL   2.  pt or wife will describe how to modify HEP over time, and the timeline associated with reduction in HEP frequency with modified independence over two sessions Baseline:  Goal status: INITIAL     PLAN:   SLP FREQUENCY:  once approx every 8 weeks   SLP DURATION:  7 sessions total   PLANNED INTERVENTIONS: Aspiration precaution training, Pharyngeal strengthening exercises, Diet toleration management , Trials of upgraded texture/liquids, SLP instruction and feedback, Compensatory strategies, and Patient/family education, [phone] (treatment of swallowing dysfunction and/or oral function for feeding)   Sriram Febles, CCC-SLP 04/19/2024, 1:58 PM

## 2024-05-29 ENCOUNTER — Encounter (HOSPITAL_COMMUNITY)
Admission: RE | Admit: 2024-05-29 | Discharge: 2024-05-29 | Disposition: A | Discharge status: Home / Self Care | Source: Ambulatory Visit | Attending: Radiology | Admitting: Radiology

## 2024-05-29 DIAGNOSIS — C109 Malignant neoplasm of oropharynx, unspecified: Secondary | ICD-10-CM | POA: Diagnosis present

## 2024-05-29 LAB — GLUCOSE, CAPILLARY: Glucose-Capillary: 95 mg/dL (ref 70–99)

## 2024-05-29 MED ORDER — FLUDEOXYGLUCOSE F - 18 (FDG) INJECTION
5.2900 | Freq: Once | INTRAVENOUS | Status: AC | PRN
  Administered 2024-05-29: 5.29 via INTRAVENOUS

## 2024-06-06 ENCOUNTER — Ambulatory Visit
Admission: RE | Admit: 2024-06-06 | Discharge: 2024-06-06 | Disposition: A | Discharge status: Home / Self Care | Payer: Self-pay | Source: Ambulatory Visit | Attending: Radiation Oncology | Admitting: Radiation Oncology

## 2024-06-06 ENCOUNTER — Encounter: Payer: Self-pay | Admitting: Radiology

## 2024-06-06 VITALS — BP 123/82 | HR 113 | Temp 97.5°F | Resp 20 | Ht 70.0 in | Wt 112.8 lb

## 2024-06-06 DIAGNOSIS — C109 Malignant neoplasm of oropharynx, unspecified: Secondary | ICD-10-CM | POA: Insufficient documentation

## 2024-06-06 NOTE — Progress Notes (Addendum)
 Patient identity verified x2   The patient returns today to see Leeroy Due PA-C for a routine follow-up and to get results from recent PET scan. He completed his treatment on 02/25/2024.   Pain issues, if any: Denies Using a feeding tube?: The patient is primarily using a feeding tube but is able to drink by mouth and consume some soft foods like applesauce and potatoes. Continued monitoring of swallowing ability is recommended.   Weight changes, if any:  Wt Readings from Last 3 Encounters: 06/06/24          112.8 lb  03/21/24 106 lb (48.1 kg)  01/12/24 107 lb 6.4 oz (48.7 kg)  01/05/24 109 lb 2 oz (49.5 kg)    Swallowing issues, if any: She reports that his throat is still sore.  Smoking or chewing tobacco? Denies Using fluoride toothpaste daily? He uses baking soda and salt water to brush gums. Last ENT visit was on: 04/13/24 Dr. Francyne Other notable issues, if any: Wife is says patient just doesn't feel good. He is more in the bed.  BP 123/82 (BP Location: Left Arm, Patient Position: Sitting)   Pulse (!) 113   Temp (!) 97.5 F (36.4 C) (Oral)   Resp 20   Ht 5' 10 (1.778 m)   Wt 112 lb 12.8 oz (51.2 kg)   SpO2 98%   BMI 16.19 kg/m

## 2024-06-06 NOTE — Progress Notes (Signed)
 Radiation Oncology         [phone] ________________________________  Name: Julian Mejia MRN: [id number]  Date: 06/06/2024  DOB: 08-Jun-1951  Follow-Up Visit Note  CC: [name], MD  Guha, Bhuvana, MD  Diagnosis and Prior Radiotherapy:    C10.2   ICD-10-CM   1. Squamous cell carcinoma of oropharynx (HCC)  C10.9          Cancer Staging  Squamous cell carcinoma of oropharynx (HCC) Staging form: Pharynx - P16 Negative Oropharynx, AJCC 8th Edition - Clinical stage from 12/22/2023: Stage IVB (cT4b, cN2c, cM0, p16-) - Signed by [name] Domino, MD on 12/22/2023  ==========DELIVERED PLANS==========  First Treatment Date: 2024-01-10 Last Treatment Date: 2024-02-25   Plan Name: HN_pharynx Site: Oropharynx Technique: IMRT Mode: Photon Dose Per Fraction: 2 Gy Prescribed Dose (Delivered / Prescribed): 70 Gy / 70 Gy Prescribed Fxs (Delivered / Prescribed): 35 / 35  Stage IVB (cT4b, 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 He completed his treatment on 02/25/2024.  Pain issues, if any: Denies Using a feeding tube?: The patient is primarily using a feeding tube but is able to drink by mouth. He has not been eating anything, due to not feeling up to it.  Weight changes, if any:     Wt Readings from Last 3 Encounters: 06/06/24          112.8 lb  03/21/24 106 lb (48.1 kg)  01/12/24 107 lb 6.4 oz (48.7 kg)  01/05/24 109 lb 2 oz (49.5 kg)  Swallowing issues, if any: She reports that his throat is still sore.  Smoking or chewing tobacco? Denies Using fluoride toothpaste daily? He uses baking soda and salt water to brush gums. Last ENT visit was on: 04/13/24 Dr. [name] Impression/Plan: 1.Stage IVB (cT4b, N2c, M0) squamous cell carcinoma of  the oropharynx, p16 negative cNED today Other notable issues, if any: Wife is says patient just doesn't feel good. He is more in the bed.  ALLERGIES:  has no known allergies.  Meds: Current Outpatient Medications  Medication Sig Dispense Refill   acetaminophen  (TYLENOL ) 160 MG/5ML suspension Take 25 mLs (800 mg total) by mouth every 6 (six) hours as needed. May also take by PEG tube. 946 mL 3   albuterol  (PROVENTIL  HFA;VENTOLIN  HFA) 108 (90 Base) MCG/ACT inhaler Inhale 2 puffs into the lungs every 6 (six) hours as needed for wheezing or shortness of breath.      alendronate (FOSAMAX) 70 MG tablet Take 70 mg by mouth every Friday. Take with a full glass of water on an empty stomach.      amLODipine  (NORVASC ) 5 MG tablet Take 10 mg by mouth daily. (Patient not taking: Reported on 02/03/2024)     cholecalciferol (VITAMIN D ) 1000 units tablet Take 2,000 Units by mouth daily.  (Patient not taking: Reported on 02/03/2024)     cyanocobalamin (VITAMIN B12) 500 MCG tablet Take 500 mcg by mouth daily. (Patient not taking: Reported on 02/03/2024)     donepezil  (ARICEPT ) 5 MG tablet Take 10 mg by mouth daily. (Patient not taking: Reported on 12/22/2023)     ibuprofen (ADVIL) 200 MG tablet Take 400 mg by mouth every 8 (eight) hours as needed for headache.     lidocaine  (XYLOCAINE ) 2 % solution Patient: Mix 1part 2% viscous  lidocaine , 1part H20. Swish & swallow 10mL of diluted mixture, 30min before meals and at bedtime, up to QID 200 mL 3   losartan  (COZAAR ) 50 MG tablet Take 50 mg by mouth daily. (Patient not taking: Reported on 02/03/2024)     metoprolol  (LOPRESSOR ) 50 MG tablet Take 50 mg by mouth daily. Hold if BP is less than 90/60     oxyCODONE  (ROXICODONE ) 5 MG/5ML solution Take 5 mLs (5 mg total) by mouth every 6 (six) hours as needed for severe pain (pain score 7-10). May also take by PEG tube. 473 mL 0   tiotropium (SPIRIVA ) 18 MCG inhalation capsule Place 18 mcg into inhaler and inhale daily.     No  current facility-administered medications for this encounter.    Physical Findings:  Wt Readings from Last 3 Encounters:  06/06/24 112 lb 12.8 oz (51.2 kg)  03/21/24 106 lb (48.1 kg)  01/12/24 107 lb 6.4 oz (48.7 kg)    height is 5' 10 (1.778 m) and weight is 112 lb 12.8 oz (51.2 kg). His oral temperature is 97.5 F (36.4 C) (abnormal). His blood pressure is 123/82 and his pulse is 113 (abnormal). His respiration is 20 and oxygen saturation is 98%. .  General: Alert and oriented, in no acute distress, cachectic.  HEENT: Head is normocephalic. Extraocular movements are intact. Oropharynx is notable for uvula is bilobed and deviated to the left. Edentulous. No concerning lesions or mucositis.  Neck: Neck is notable for no palpable masses.  Skin: Skin in treatment fields shows satisfactory healing within the treatment field.  Heart: Regular in rate and rhythm with no murmurs, rubs, or gallops. Chest: Clear to auscultation bilaterally, with no rhonchi, wheezes, or rales. Abdomen: Soft, nontender, nondistended, with no rigidity or guarding. PEG tube in place with no signs of infection.  Extremities: No cyanosis or edema. Lymphatics: see Neck Exam Psychiatric: Judgment and insight are intact. Affect is appropriate.   Lab Findings: Lab Results  Component Value Date   WBC 5.2 01/12/2024   HGB 10.7 (L) 01/12/2024   HCT 31.7 (L) 01/12/2024   MCV 91.9 01/12/2024   PLT 274 01/12/2024    Lab Results  Component Value Date   TSH 1.775 01/12/2024    Radiographic Findings: NM PET Image Restag (PS) Skull Base To Thigh Result Date: 06/06/2024 CLINICAL DATA:  Subsequent treatment strategy for tumor type. EXAM: NUCLEAR MEDICINE PET SKULL BASE TO THIGH TECHNIQUE: Head neck carcinoma mCi F-18 FDG was injected intravenously. Full-ring PET imaging was performed from the skull base to thigh after the radiotracer. CT data was obtained and used for attenuation correction and anatomic localization.  Fasting blood glucose: 95 mg/dl COMPARISON:  FDG PET scan 12/10/2023 FINDINGS: NECK: Interval resolution of the previously intensely hypermetabolic activity in the posterior oropharynx. Additionally interval resolution of the activity in the anterior floor the mouth. Interval resolution of the hypermetabolic cervical lymph nodes.  N Faint metabolic activity associated with a small RIGHT level 2 lymph node measuring 4 mm on image 39/4 with SUV max equal 3.3. Incidental CT findings: None. CHEST: New peripheral hypermetabolic nodule in the LEFT upper lobe measures 13 mm along the anterior pleural surface on image 59/4 with SUV max equal 6.9. New peripheral hypermetabolic nodule in the posterior RIGHT upper lobe measuring 10 mm on image 77 with SUV max equal 5.3. New nodule in the RIGHT upper lobe measuring 7 mm on image 90 also with metabolic activity although faint. New pulmonary nodule the medial RIGHT middle  lobe measuring 12 mm on image 97 with SUV max equal 2.7. No hypermetabolic mediastinal lymph nodes. Incidental CT findings: Is ABDOMEN/PELVIS: No abnormal hypermetabolic activity within the liver, pancreas, adrenal glands, or spleen. No hypermetabolic lymph nodes in the abdomen or pelvis. Mild metabolic activity associated with the peg tube is favored inflammatory Incidental CT findings: None. SKELETON: No focal hypermetabolic activity to suggest skeletal metastasis. Incidental CT findings: None. IMPRESSION: 1. Interval resolution of the hypermetabolic activity in the posterior oropharynx and anterior floor the mouth. 2. Interval resolution of the hypermetabolic cervical lymph nodes. 3. Small RIGHT level 2 lymph node with mild metabolic activity is favored benign. 4. New hypermetabolic pulmonary nodules in the LEFT and RIGHT upper lobe and RIGHT middle lobe. Findings most consistent with METASTATIC PULMONARY NODULES. 5. Mild activity associated with the PEG tube is favored postprocedural inflammation.  Electronically Signed   By: Jackquline Boxer M.D.   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  PET  from 05/29/2024 demonstrates resolution of the treated oropharynx and cervical lymph nodes. Unfortunately, new pulmonary nodules were also seen, concerning for metastatic disease. We have personally shared this case with his previous medical oncologist, Dr. Rosellen at [facility]. We will refer the patient back to them for consideration of systemic treatment. If he is not a candidate for systemic treatment, Dr. [name] has recommended stereotactic body radiotherapy (SBRT) to treat the three pulmonary nodules.  2) Nutritional Status: Stable. Patient is not interested in eating foods orally.  Wt Readings from Last 3 Encounters:  06/06/24 112 lb 12.8 oz (51.2 kg)  03/21/24 106 lb (48.1 kg)  01/12/24 107 lb 6.4 oz (48.7 kg)  PEG tube: approximately 4 cartons/day   3) Risk Factors: The patient has been educated about risk factors including alcohol and tobacco abuse; they understand that avoidance of alcohol and tobacco is important to prevent recurrences as well as other cancers. Patient is no longer smoking.   4) Swallowing: Patient is tolerating water, but has not been eating anything orally due to preference. Encouraged patient to push oral intake today. He is scheduled to see SLP on 06/12/2024  5) Dental: Edentulous - patient using water and baking soda rinses.   6) Thyroid  function: Checking annually  Lab Results  Component Value Date   TSH 1.775 01/12/2024    8) Patient is scheduled to see Dr. [name] on 06/29/2024. We are scheduling a follow-up appointment in 3 months. This can be cancelled if patient decides to proceed with systemic treatment. If he  decides to proceed with radiation therapy, we are happy to see him sooner. The patient was encouraged to call with any issues or questions before then.    On date of service, in total, we spent 30 minutes on this encounter. Patient was seen in person. _____________________________________    Leeroy Due, PA-C   Lauraine [name], MD    [facility]  Radiation Oncology Direct Dial: [phone]  Fax: [phone] [email or web address]

## 2024-06-09 ENCOUNTER — Telehealth: Payer: Self-pay | Admitting: Radiation Oncology

## 2024-06-09 NOTE — Telephone Encounter (Signed)
 Spoke to pt's wife to schedule 16M f/u with PA Wyatt. Wife agreeable to 12/2@10 :40am. She advised they will have f/u with Oncologist in the coming weeks. She was advised typically we wait to receive referral from provider since this will provide us  with updated notes, labs, etc. She was encouraged to call if they needed cancellation of f/u appt. She verbalized understanding.

## 2024-06-12 ENCOUNTER — Ambulatory Visit: Attending: Radiation Oncology

## 2024-06-12 DIAGNOSIS — R1310 Dysphagia, unspecified: Secondary | ICD-10-CM | POA: Insufficient documentation

## 2024-06-12 NOTE — Therapy (Signed)
 OUTPATIENT SPEECH LANGUAGE PATHOLOGY ONCOLOGY TREATMENT   Patient Name: Julian Mejia MRN: [id number] DOB:[birth year], 73 y.o., male Today's Date: 06/12/2024  PCP: [name], MD REFERRING PROVIDER: [name], MD  END OF SESSION:  End of Session - 06/12/24 1324     Visit Number 5    Number of Visits 7    Date for SLP Re-Evaluation 06/13/24    SLP Start Time 1322    SLP Stop Time  1353    SLP Time Calculation (min) 31 min    Activity Tolerance Patient tolerated treatment well            Past Medical History:  Diagnosis Date   COPD (chronic obstructive pulmonary disease) (HCC)    Hypertension    Past Surgical History:  Procedure Laterality Date   HERNIA REPAIR     REPAIR OF PERFORATED ULCER     TOTAL HIP ARTHROPLASTY Right 05/08/2016   Procedure: TOTAL HIP ARTHROPLASTY ANTERIOR APPROACH;  Surgeon: [name] Cummins, MD;  Location: [facility] OR;  Service: Orthopedics;  Laterality: Right;   Patient Active Problem List   Diagnosis Date Noted   Inadequate nutrition 01/13/2024   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 See below for more details.   Remaining deficits: Dysphagia with puree seen today. Suspect muscle disuse atrophy as a cause of this as pt has been PEG dependent for some time.    Education / Equipment: See individual therapy session notes.   Patient agrees to discharge. Patient goals were not met. Patient is being discharged due to being pleased with the current functional level..      ONSET DATE: See  pertinent history below   REFERRING DIAG:  C14.8 (ICD-10-CM) - Malignant neoplasm of overlapping sites of lip, oral cavity and pharynx      THERAPY DIAG:  Dysphagia, unspecified type  Rationale for Evaluation and Treatment: Rehabilitation  SUBJECTIVE:   SUBJECTIVE STATEMENT: Pt is having water regularly but remains PEG dependent. Appointment 06/14/24 at [facility] to discuss probable lung mets.  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 He presented to Dr. [name] [facility]) with complaints of a sore throat and unintentional weight loss. 12/02/24 CT neck showing an asymmetric and suspicious soft tissue along the left lateral wall of the pharynx, most pronounced at the level of the uvula and extending inferiorly to the left hypopharynx. Scan also indicated a a small right level 2A lymph node is abnormally heterogeneous, measuring 6-7 mm at the greatest extend and a slightly larger heterogeneous right level 2 B lymph node. CT chest done then demonstrated no acute process or explanation for weight loss and no evidence of metastatic disease.  12/09/23 He saw Dr. [name] at [facility] who performed a flexible fiberoptic laryngoscopy showing a left pharyngeal lesion extending from the soft palate to the hypopharynx. The posterior soft palate, uvula, tongue base, epiglottis, aryepiglottic folds, hypopharynx, supraglottis, glottis and vallecula were visualized and appeared healthy without mucosal masses or lesions. 12/09/23  Biopsy of left tonsil revealed moderately differentiated keratinizing SCC. 12/10/23 PET revealed a hypermetabolic oropharyngeal mass measuring 1.3 x 1.7 cm with involvement of the deep/lateral right fossa of Rosenmller, left nasopharyngeal soft tissues and bilateral level II cervical lymph nodes. 12/22/23 consult with Dr. [name]. He also consulted with medical oncology at the [facility] and  was eventually referred here for consult with Dr. Autumn on 01/12/24. He started radiation and may receive chemotherapy. Treatment plan:  He will receive 35 fractions of radiation to his Pharynx and bilateral neck which started on 01/10/24,and will end on 02/25/24.  Pretreatment procedures:12/27/23 Full mouth dental extractions, 12/29/23 PEG placed  PAIN:  Are you having pain? No  FALLS: Has patient fallen in last 6 months?  No  PATIENT GOALS: Maintain WNL swallowing  OBJECTIVE:  Note: Objective measures were completed at Evaluation unless otherwise noted. DIAGNOSTIC FINDINGS: see pertinent history                                                                                                                            TREATMENT DATE:   06/12/24: Wife reports pt still c/o dysgeusia which makes pt refuse food. Last week she provided multiple food items but pt refused each. Pt drinks water throughout the day, and has done this for at least the last 2 months. When eating 1/4-1/2 tsp applesauce today audible s/sx of reduced pharyngeal clearance were heard. At this time pt swallowing is deemed WNL/WFL with thin liquids; currently he remains PEG dependent. He has an appointment at the [facility] on 06/14/24 to discuss suspected lung mets. It was decided best not to push MBS at this time given pt's medical status and an unknown treatment plan for suspected lung metastasis. SLP provided encouragement to cont with liquids unless pt develops s/sx pharyngeal dysphagia and/or s/sx aspiration PNA at which time a MBS may be warranted.  Pt has primarily not completed HEP since last appointment. SLP demonstrated each exercise (except Shaker) for pt and he return demonstrated with 100% success. SLP encouraged pt to complete 5 reps of each up to 10 each, each exercise each day, building to at LEAST 20/day. Pt did not give an indication if he will or will not complete this. SLP pt and wife agreed pt could be discharged at this time.    04/19/24: Pt is drinking water - approx 12 oz/day, and having a few bites of pureed each day, mostly due to ageusia/dysgeusia, per wife. SLP gave rationale for incr'd PO intake and suggested pt ingest twice the water and having 4-5 teaspoons of soft/mushy food and/or puree 8-10 times a day. SLP ensured pt and wife understood rationale.  With HEP, pt req'd model (except for Shaker), but was able to complete reps of effortful, Masako, and Mendelsohn. SLP told pt and wife rationale for HEP and ensured pt understood. Pt and wife agreed pt could return in >4 weeks. Next visit 06/12/24.  03/21/24: Pt cont in bed at home, other  than appointments. SLP encouraged pt to arise and do some physical exercise. He is not completing HEP. SLP re-educated pt (and wife) on the importance of completion of HEP. Pt was unable to tell SLP rationale later in session and req'd total A. SLP demonstrated each exercise (except Shaker) to pt and wife today and pt was mod I/independent prior to SLP moving on to demo next exercise. SLP stressed pt should adhere to HEP to limit/eliminate the risk of muscle fibrosis and/or atrophy. SLP educated pt and wife on benefits of food journal and they demonstrated understanding. SLP also shared overt s/sx of aspiration PNA and understanding was demonstrated. Possible to reduce frequency to every 8 weeks after next session.  02/17/24: Pt is in bed at home, other than POs, or coming to [facility]. SLP stressed need for cont'd POs to mitigate muscle disuse atrophy. Wife stated she has pt complete Masako (5 reps) in the car as much as she can. SLP reiterated minimum/day is 20 reps (and Shaker is 6 reps/day). SLP stressed need for 20 reps minimum per day (except Shaker), and it will be best to cycle through ALL exercises. Pt performed appropriately with HEP after seeing SLP demo. He drank water today without overt s/sx oral or overt pharyngeal deficits.  01/20/24: Research states the risk for dysphagia  increases due to radiation and/or chemotherapy treatment due to a variety of factors, so SLP educated the pt about the possibility of reduced/limited ability for PO intake during rad tx. SLP also educated pt regarding possible changes to swallowing musculature after rad tx, and why adherence to dysphagia HEP provided today and PO consumption was necessary to inhibit muscle fibrosis following rad tx and to mitigate muscle disuse atrophy. SLP informed pt why this would be detrimental to their swallowing status and to their pulmonary health. Pt demonstrated understanding of these things to SLP. SLP encouraged pt to safely eat and drink as deep into their radiation/chemotherapy as possible to provide the best possible long-term swallowing outcome for pt. SLP then developed an individualized HEP for pt involving oral and pharyngeal strengthening and ROM and pt was instructed how to perform these exercises, including SLP demonstration. After SLP demonstration, pt return demonstrated each exercise. SLP ensured pt performance was correct prior to educating pt on next exercise. Pt required occasional mod cues faded to modified independent to perform HEP. Wife was present and will assist pt with HEP PRN. Pt was instructed to complete this program 5-7 days/week, at least 20 reps a day until 6 months after his or her last day of rad tx, and then x2 a week after that, indefinitely.   PATIENT EDUCATION: Education details: late effects head/neck radiation on swallow function and HEP procedure Person educated: Patient and Spouse Education method: Explanation, Demonstration, Verbal cues, and Handouts Education comprehension: verbalized understanding, returned demonstration, verbal cues required, and needs further education   ASSESSMENT:  CLINICAL IMPRESSION: Patient is a 73 y.o. M who was seen today for treatment of swallowing as after undergoing radiation/chemoradiation therapy. Today pt drank thin liquids without  overt s/s oral or pharyngeal difficulty. Wife states pt drinks water throughout the day, and has for at least the last 2 months. When eating applesauce audible s/sx of reduced pharyngeal clearance were observed. At this time pt swallowing is deemed WNL/WFL with thin liquids; currently he is PEG dependent with an appointment to discuss 3 spots of newly identified lung CA, at the [place] on 06/14/24. It was decided best not to push MBS at  this time, pending a treatment plan for lung CA. There are no overt s/s aspiration PNA observed by SLP nor any reported by pt at this time. Data indicate that pt's swallow ability could very well decline over time following the conclusion of rad therapy due to muscle disuse atrophy and/or muscle fibrosis. Pt will cont to need to be seen by SLP in order to assess safety of PO intake, assess the need for recommending any objective swallow assessment, and ensuring pt is correctly completing the individualized HEP.  OBJECTIVE IMPAIRMENTS: include memory and dysphagia. These impairments are limiting patient from safety when swallowing. Factors affecting potential to achieve goals and functional outcome are ability to learn/carryover information. Patient will benefit from skilled SLP services to address above impairments and improve overall function.  REHAB POTENTIAL: Good   GOALS: Goals reviewed with patient?  Yes, in general SHORT TERM GOALS: Target: 3rd total session   1. Pt will compelte HEP with rare min A in 2 sessions Baseline: Goal status: not met   2.  pt will tell SLP why pt is completing HEP with min A Baseline:  Goal status: not met   3.  pt or wife will describe 3 overt s/s aspiration PNA with modified independence Baseline:  Goal status: met   4.  pt or wife will tell SLP how a food journal could hasten return to a more normalized diet Baseline:  Goal status: met     LONG TERM GOALS: Target: 7th total session   1.  pt will complete HEP with modified  independence over two visits Baseline:  Goal status: not met   2.  pt or wife will describe how to modify HEP over time, and the timeline associated with reduction in HEP frequency with modified independence over two sessions Baseline:  Goal status: not met     PLAN:  discharge today   PLANNED INTERVENTIONS: Aspiration precaution training, Pharyngeal strengthening exercises, Diet toleration management , Trials of upgraded texture/liquids, SLP instruction and feedback, Compensatory strategies, and Patient/family education, [phone] (treatment of swallowing dysfunction and/or oral function for feeding)   Roxie Kreeger, CCC-SLP 06/12/2024, 2:02 PM

## 2024-06-12 NOTE — Patient Instructions (Signed)
     Signs you may have developed trouble with your swallowing:  Items you used to eat or drink well,  you now have difficulty passing through the throat or are choking or coughing  when  you eat or drink them  You are clearing your throat often when you are eating or drinking  You have a wet voice when you are eating or drinking  If you have one or more of these signs contact your ENT or Dr. [name] (if she is still following you)

## 2024-06-16 ENCOUNTER — Telehealth: Payer: Self-pay | Admitting: Radiation Oncology

## 2024-06-16 NOTE — Telephone Encounter (Signed)
 9/12 Received referral from [facility], left voicemail with Levon SAUNDERS, ref coord, to fax over any recent path or images report for new diagnosis.  Waiting on fax and/or call back to confirm.

## 2024-06-19 NOTE — Progress Notes (Signed)
 Radiation Oncology         [phone] ________________________________  Name: Julian Mejia MRN: [id number]  Date: 06/20/2024  DOB: 1950/10/26  Follow-Up Visit Note  CC: [name], MD  Guha, Bhuvana, MD  Diagnosis and Prior Radiotherapy:    C10.2 No diagnosis found. 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   ***  ALLERGIES:  has no known allergies.  Meds: Current Outpatient Medications  Medication Sig Dispense Refill   acetaminophen  (TYLENOL ) 160 MG/5ML suspension Take 25 mLs (800 mg total) by mouth every 6 (six) hours as needed. May also take by PEG tube. 946 mL 3   albuterol  (PROVENTIL  HFA;VENTOLIN  HFA) 108 (90 Base) MCG/ACT inhaler Inhale 2 puffs into the lungs every 6 (six) hours as needed for wheezing or shortness of breath.      alendronate (FOSAMAX) 70 MG tablet Take 70 mg by mouth every Friday. Take with a full glass of water on an empty stomach.      amLODipine  (NORVASC ) 5 MG tablet Take 10 mg by mouth daily. (Patient not taking: Reported on 02/03/2024)     cholecalciferol (VITAMIN D ) 1000 units tablet Take 2,000 Units  by mouth daily.  (Patient not taking: Reported on 02/03/2024)     cyanocobalamin (VITAMIN B12) 500 MCG tablet Take 500 mcg by mouth daily. (Patient not taking: Reported on 02/03/2024)     donepezil  (ARICEPT ) 5 MG tablet Take 10 mg by mouth daily. (Patient not taking: Reported on 12/22/2023)     ibuprofen (ADVIL) 200 MG tablet Take 400 mg by mouth every 8 (eight) hours as needed for headache.     lidocaine  (XYLOCAINE ) 2 % solution Patient: Mix 1part 2% viscous lidocaine , 1part H20. Swish & swallow 10mL of diluted mixture, 30min before meals and at bedtime, up to QID 200 mL 3   losartan  (COZAAR ) 50 MG tablet Take 50 mg by mouth daily. (Patient not taking: Reported on 02/03/2024)     metoprolol  (LOPRESSOR ) 50 MG tablet Take 50 mg by mouth daily. Hold if BP is less than 90/60     oxyCODONE  (ROXICODONE ) 5 MG/5ML solution Take 5 mLs (5 mg total) by mouth every 6 (six) hours as needed for severe pain (pain score 7-10). May also take by PEG tube. 473 mL 0   tiotropium (SPIRIVA ) 18 MCG inhalation capsule Place 18 mcg into inhaler and inhale daily.     No current facility-administered medications for this visit.    Physical Findings:  Wt Readings from Last 3 Encounters:  06/06/24 112 lb 12.8 oz (51.2 kg)  03/21/24 106 lb (48.1 kg)  01/12/24 107 lb 6.4 oz (48.7 kg)    vitals were not taken for this visit. SABRA  General: Alert and oriented, in no acute distress, cachectic.  HEENT: Head is normocephalic. Extraocular movements are intact. Oropharynx is notable for uvula is bilobed and deviated to the left. Edentulous. No concerning lesions or mucositis.  Neck: Neck is notable for no palpable masses.  Skin: Skin in treatment fields shows satisfactory healing within the treatment field.  Heart: Regular in rate and rhythm with no murmurs, rubs, or gallops. Chest: Clear to auscultation bilaterally, with no rhonchi, wheezes, or rales. Abdomen: Soft, nontender, nondistended, with no rigidity or guarding. PEG tube in  place with no signs of infection.  Extremities: No cyanosis or edema. Lymphatics: see Neck Exam Psychiatric: Judgment and insight are intact. Affect is appropriate.   Lab Findings: Lab Results  Component Value Date   WBC 5.2 01/12/2024   HGB 10.7 (L) 01/12/2024   HCT 31.7 (L) 01/12/2024   MCV 91.9 01/12/2024   PLT 274 01/12/2024    Lab Results  Component Value Date   TSH 1.775 01/12/2024    Radiographic Findings: NM PET Image Restag (PS) Skull Base To Thigh Result Date: 06/06/2024 CLINICAL DATA:  Subsequent treatment strategy for tumor type. EXAM: NUCLEAR MEDICINE PET SKULL BASE TO THIGH TECHNIQUE: Head neck carcinoma mCi F-18 FDG was injected intravenously. Full-ring PET imaging was performed from the skull base to thigh after the radiotracer. CT data was obtained and used for attenuation correction and anatomic localization. Fasting blood glucose: 95 mg/dl COMPARISON:  FDG PET scan 12/10/2023 FINDINGS: NECK: Interval resolution of the previously intensely hypermetabolic activity in the posterior oropharynx. Additionally interval resolution of the activity in the anterior floor the mouth. Interval resolution of the hypermetabolic cervical lymph nodes.  N Faint metabolic activity associated with a small RIGHT level 2 lymph node measuring 4 mm on image 39/4 with SUV max equal 3.3. Incidental CT findings: None. CHEST: New peripheral hypermetabolic nodule in the LEFT upper lobe measures 13 mm along the anterior pleural surface on image 59/4 with SUV max equal 6.9. New peripheral hypermetabolic nodule in the posterior RIGHT upper lobe measuring 10 mm on image 77 with SUV max equal 5.3. New nodule in the RIGHT upper lobe measuring 7 mm on image 90 also with metabolic activity although faint. New pulmonary nodule the medial RIGHT middle lobe measuring 12 mm on image 97 with SUV max equal 2.7. No hypermetabolic mediastinal lymph nodes. Incidental CT findings: Is ABDOMEN/PELVIS: No abnormal  hypermetabolic activity within the liver, pancreas, adrenal glands, or spleen. No hypermetabolic lymph nodes in the abdomen or pelvis. Mild metabolic activity associated with the peg tube is favored inflammatory Incidental CT findings: None. SKELETON: No focal hypermetabolic activity to suggest skeletal metastasis. Incidental CT findings: None. IMPRESSION: 1. Interval resolution of the hypermetabolic activity in the posterior oropharynx and anterior floor the mouth. 2. Interval resolution of the hypermetabolic cervical lymph nodes. 3. Small RIGHT level 2 lymph node with mild metabolic activity is favored benign. 4. New hypermetabolic pulmonary nodules in the LEFT and RIGHT upper lobe and RIGHT middle lobe. Findings most consistent with METASTATIC PULMONARY NODULES. 5. Mild activity associated with the PEG tube is favored postprocedural inflammation. Electronically Signed   By: Jackquline Boxer M.D.   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  PET  from 05/29/2024 demonstrates resolution of  the treated oropharynx and cervical lymph nodes. Unfortunately, new pulmonary nodules were also seen, concerning for metastatic disease. We have personally shared this case with his previous medical oncologist, Dr. Rosellen at [facility]. Dr [name] and Dr Lang notified as well.  Dr. Shermon resident will arrange followup in med onc [facility]) for consideration of systemic treatment. If he is not a candidate for systemic treatment, Dr. [name] has recommended stereotactic body radiotherapy (SBRT) to treat the three pulmonary nodules.  2) Nutritional Status: Stable. Patient is not interested in eating foods orally.  Wt Readings from Last 3 Encounters:  06/06/24 112 lb 12.8 oz  (51.2 kg)  03/21/24 106 lb (48.1 kg)  01/12/24 107 lb 6.4 oz (48.7 kg)  PEG tube: approximately 4 cartons/day   3) Risk Factors: The patient has been educated about risk factors including alcohol and tobacco abuse; they understand that avoidance of alcohol and tobacco is important to prevent recurrences as well as other cancers. Patient is no longer smoking.   4) Swallowing: Patient is tolerating water, but has not been eating anything orally due to preference. Encouraged patient to push oral intake today. He is scheduled to see SLP on 06/12/2024  5) Dental: Edentulous - patient using water and baking soda rinses.   6) Thyroid  function: Checking annually  Lab Results  Component Value Date   TSH 1.775 01/12/2024    8) Patient is scheduled to see Dr. [name] on 06/29/2024. We are scheduling a follow-up appointment in 3 months. This can be cancelled if patient decides to proceed with systemic treatment. If he decides to proceed with radiation therapy, we are happy to see him sooner. The patient was encouraged to call with any issues or questions before then.    On date of service, in total, we spent 30 minutes on this encounter. Patient was seen in person. Note signed after encounter date; minutes pertain to date of service, only.  _____________________________________    Leeroy Due, PA-C   Lauraine [name], MD    [facility]  Radiation Oncology Direct Dial: [phone]  Fax: [phone] [email or web address]

## 2024-06-20 ENCOUNTER — Ambulatory Visit
Admission: RE | Admit: 2024-06-20 | Discharge: 2024-06-20 | Disposition: A | Discharge status: Home / Self Care | Source: Ambulatory Visit | Attending: Radiology | Admitting: Radiology

## 2024-06-20 VITALS — BP 107/68 | HR 82 | Temp 97.7°F | Resp 20 | Ht 70.0 in | Wt 111.8 lb

## 2024-06-20 DIAGNOSIS — C7801 Secondary malignant neoplasm of right lung: Secondary | ICD-10-CM | POA: Insufficient documentation

## 2024-06-20 DIAGNOSIS — Z7983 Long term (current) use of bisphosphonates: Secondary | ICD-10-CM | POA: Diagnosis not present

## 2024-06-20 DIAGNOSIS — Z79899 Other long term (current) drug therapy: Secondary | ICD-10-CM | POA: Insufficient documentation

## 2024-06-20 DIAGNOSIS — C109 Malignant neoplasm of oropharynx, unspecified: Secondary | ICD-10-CM | POA: Insufficient documentation

## 2024-06-20 DIAGNOSIS — C7802 Secondary malignant neoplasm of left lung: Secondary | ICD-10-CM | POA: Diagnosis not present

## 2024-06-20 DIAGNOSIS — Z923 Personal history of irradiation: Secondary | ICD-10-CM | POA: Insufficient documentation

## 2024-06-20 NOTE — Progress Notes (Addendum)
   The patient returns today follow-up to discuss treatment options for his metastatic disease.     Treatment Completion Date: 02/25/24 Pain issues, if any: Denies Using a feeding tube?: Tolerating well. Weight changes, if any:  Wt Readings from Last 4 Encounters: 06/20/24          111.8 lb  03/21/24 106 lb (48.1 kg)  01/12/24 107 lb 6.4 oz (48.7 kg)  01/05/24 109 lb 2 oz (49.5 kg)   Swallowing issues, if any:  No issues, but wife reports that he still has no appetite.  Smoking or chewing tobacco? Denies. Patient has quit smoking.  Using fluoride toothpaste daily? He uses salt and baking soda rinse to brush gums.  Last ENT visit was on: 03/17/24  Dr. [facility] in [place]. Other notable issues, if any: Wife is concerned about the next steps for radiation or immunotherapy.  BP 107/68 (BP Location: Left Arm, Patient Position: Sitting, Cuff Size: Normal)   Pulse 82   Temp 97.7 F (36.5 C)   Resp 20   Ht 5' 10 (1.778 m)   Wt 111 lb 12.8 oz (50.7 kg)   SpO2 97%   BMI 16.04 kg/m

## 2024-06-30 ENCOUNTER — Ambulatory Visit
Admission: RE | Admit: 2024-06-30 | Discharge: 2024-06-30 | Disposition: A | Discharge status: Home / Self Care | Source: Ambulatory Visit | Attending: Radiation Oncology | Admitting: Radiation Oncology

## 2024-06-30 DIAGNOSIS — C109 Malignant neoplasm of oropharynx, unspecified: Secondary | ICD-10-CM | POA: Diagnosis not present

## 2024-07-04 DIAGNOSIS — C109 Malignant neoplasm of oropharynx, unspecified: Secondary | ICD-10-CM | POA: Diagnosis not present

## 2024-07-14 ENCOUNTER — Ambulatory Visit
Admission: RE | Admit: 2024-07-14 | Discharge: 2024-07-14 | Disposition: A | Discharge status: Home / Self Care | Source: Ambulatory Visit | Attending: Radiation Oncology | Admitting: Radiation Oncology

## 2024-07-14 ENCOUNTER — Other Ambulatory Visit: Payer: Self-pay

## 2024-07-14 DIAGNOSIS — C109 Malignant neoplasm of oropharynx, unspecified: Secondary | ICD-10-CM | POA: Diagnosis present

## 2024-07-14 DIAGNOSIS — Z510 Encounter for antineoplastic radiation therapy: Secondary | ICD-10-CM | POA: Insufficient documentation

## 2024-07-14 DIAGNOSIS — C7802 Secondary malignant neoplasm of left lung: Secondary | ICD-10-CM | POA: Insufficient documentation

## 2024-07-14 DIAGNOSIS — C7801 Secondary malignant neoplasm of right lung: Secondary | ICD-10-CM | POA: Insufficient documentation

## 2024-07-14 LAB — RAD ONC ARIA SESSION SUMMARY
Course Elapsed Days: 0
Plan Fractions Treated to Date: 1
Plan Fractions Treated to Date: 1
Plan Fractions Treated to Date: 1
Plan Fractions Treated to Date: 1
Plan Prescribed Dose Per Fraction: 10 Gy
Plan Prescribed Dose Per Fraction: 10 Gy
Plan Prescribed Dose Per Fraction: 10 Gy
Plan Prescribed Dose Per Fraction: 10 Gy
Plan Total Fractions Prescribed: 5
Plan Total Fractions Prescribed: 5
Plan Total Fractions Prescribed: 5
Plan Total Fractions Prescribed: 5
Plan Total Prescribed Dose: 50 Gy
Plan Total Prescribed Dose: 50 Gy
Plan Total Prescribed Dose: 50 Gy
Plan Total Prescribed Dose: 50 Gy
Reference Point Dosage Given to Date: 10 Gy
Reference Point Dosage Given to Date: 10 Gy
Reference Point Dosage Given to Date: 10 Gy
Reference Point Dosage Given to Date: 10 Gy
Reference Point Session Dosage Given: 10 Gy
Reference Point Session Dosage Given: 10 Gy
Reference Point Session Dosage Given: 10 Gy
Reference Point Session Dosage Given: 10 Gy
Session Number: 1

## 2024-07-17 ENCOUNTER — Other Ambulatory Visit: Payer: Self-pay

## 2024-07-17 ENCOUNTER — Ambulatory Visit
Admission: RE | Admit: 2024-07-17 | Discharge: 2024-07-17 | Disposition: A | Discharge status: Home / Self Care | Source: Ambulatory Visit | Attending: Radiation Oncology | Admitting: Radiation Oncology

## 2024-07-17 DIAGNOSIS — C109 Malignant neoplasm of oropharynx, unspecified: Secondary | ICD-10-CM

## 2024-07-17 DIAGNOSIS — Z510 Encounter for antineoplastic radiation therapy: Secondary | ICD-10-CM | POA: Diagnosis not present

## 2024-07-17 LAB — RAD ONC ARIA SESSION SUMMARY
Course Elapsed Days: 3
Plan Fractions Treated to Date: 2
Plan Fractions Treated to Date: 2
Plan Fractions Treated to Date: 2
Plan Fractions Treated to Date: 2
Plan Prescribed Dose Per Fraction: 10 Gy
Plan Prescribed Dose Per Fraction: 10 Gy
Plan Prescribed Dose Per Fraction: 10 Gy
Plan Prescribed Dose Per Fraction: 10 Gy
Plan Total Fractions Prescribed: 5
Plan Total Fractions Prescribed: 5
Plan Total Fractions Prescribed: 5
Plan Total Fractions Prescribed: 5
Plan Total Prescribed Dose: 50 Gy
Plan Total Prescribed Dose: 50 Gy
Plan Total Prescribed Dose: 50 Gy
Plan Total Prescribed Dose: 50 Gy
Reference Point Dosage Given to Date: 20 Gy
Reference Point Dosage Given to Date: 20 Gy
Reference Point Dosage Given to Date: 20 Gy
Reference Point Dosage Given to Date: 20 Gy
Reference Point Session Dosage Given: 10 Gy
Reference Point Session Dosage Given: 10 Gy
Reference Point Session Dosage Given: 10 Gy
Reference Point Session Dosage Given: 10 Gy
Session Number: 2

## 2024-07-18 ENCOUNTER — Ambulatory Visit: Admitting: Radiation Oncology

## 2024-07-19 ENCOUNTER — Other Ambulatory Visit: Payer: Self-pay

## 2024-07-19 ENCOUNTER — Ambulatory Visit
Admission: RE | Admit: 2024-07-19 | Discharge: 2024-07-19 | Disposition: A | Discharge status: Home / Self Care | Source: Ambulatory Visit | Attending: Radiation Oncology | Admitting: Radiation Oncology

## 2024-07-19 DIAGNOSIS — Z510 Encounter for antineoplastic radiation therapy: Secondary | ICD-10-CM | POA: Diagnosis not present

## 2024-07-19 LAB — RAD ONC ARIA SESSION SUMMARY
Course Elapsed Days: 5
Plan Fractions Treated to Date: 3
Plan Fractions Treated to Date: 3
Plan Fractions Treated to Date: 3
Plan Fractions Treated to Date: 3
Plan Prescribed Dose Per Fraction: 10 Gy
Plan Prescribed Dose Per Fraction: 10 Gy
Plan Prescribed Dose Per Fraction: 10 Gy
Plan Prescribed Dose Per Fraction: 10 Gy
Plan Total Fractions Prescribed: 5
Plan Total Fractions Prescribed: 5
Plan Total Fractions Prescribed: 5
Plan Total Fractions Prescribed: 5
Plan Total Prescribed Dose: 50 Gy
Plan Total Prescribed Dose: 50 Gy
Plan Total Prescribed Dose: 50 Gy
Plan Total Prescribed Dose: 50 Gy
Reference Point Dosage Given to Date: 30 Gy
Reference Point Dosage Given to Date: 30 Gy
Reference Point Dosage Given to Date: 30 Gy
Reference Point Dosage Given to Date: 30 Gy
Reference Point Session Dosage Given: 10 Gy
Reference Point Session Dosage Given: 10 Gy
Reference Point Session Dosage Given: 10 Gy
Reference Point Session Dosage Given: 10 Gy
Session Number: 3

## 2024-07-20 ENCOUNTER — Ambulatory Visit: Admitting: Radiation Oncology

## 2024-07-21 ENCOUNTER — Other Ambulatory Visit: Payer: Self-pay

## 2024-07-21 ENCOUNTER — Ambulatory Visit
Admission: RE | Admit: 2024-07-21 | Discharge: 2024-07-21 | Disposition: A | Discharge status: Home / Self Care | Source: Ambulatory Visit | Attending: Radiation Oncology | Admitting: Radiation Oncology

## 2024-07-21 DIAGNOSIS — Z510 Encounter for antineoplastic radiation therapy: Secondary | ICD-10-CM | POA: Diagnosis not present

## 2024-07-21 LAB — RAD ONC ARIA SESSION SUMMARY
Course Elapsed Days: 7
Plan Fractions Treated to Date: 4
Plan Fractions Treated to Date: 4
Plan Fractions Treated to Date: 4
Plan Fractions Treated to Date: 4
Plan Prescribed Dose Per Fraction: 10 Gy
Plan Prescribed Dose Per Fraction: 10 Gy
Plan Prescribed Dose Per Fraction: 10 Gy
Plan Prescribed Dose Per Fraction: 10 Gy
Plan Total Fractions Prescribed: 5
Plan Total Fractions Prescribed: 5
Plan Total Fractions Prescribed: 5
Plan Total Fractions Prescribed: 5
Plan Total Prescribed Dose: 50 Gy
Plan Total Prescribed Dose: 50 Gy
Plan Total Prescribed Dose: 50 Gy
Plan Total Prescribed Dose: 50 Gy
Reference Point Dosage Given to Date: 40 Gy
Reference Point Dosage Given to Date: 40 Gy
Reference Point Dosage Given to Date: 40 Gy
Reference Point Dosage Given to Date: 40 Gy
Reference Point Session Dosage Given: 10 Gy
Reference Point Session Dosage Given: 10 Gy
Reference Point Session Dosage Given: 10 Gy
Reference Point Session Dosage Given: 10 Gy
Session Number: 4

## 2024-07-24 ENCOUNTER — Other Ambulatory Visit: Payer: Self-pay

## 2024-07-24 ENCOUNTER — Ambulatory Visit
Admission: RE | Admit: 2024-07-24 | Discharge: 2024-07-24 | Disposition: A | Discharge status: Home / Self Care | Source: Ambulatory Visit | Attending: Radiation Oncology | Admitting: Radiation Oncology

## 2024-07-24 ENCOUNTER — Ambulatory Visit

## 2024-07-24 DIAGNOSIS — Z510 Encounter for antineoplastic radiation therapy: Secondary | ICD-10-CM | POA: Diagnosis not present

## 2024-07-24 LAB — RAD ONC ARIA SESSION SUMMARY
Course Elapsed Days: 10
Plan Fractions Treated to Date: 5
Plan Fractions Treated to Date: 5
Plan Fractions Treated to Date: 5
Plan Fractions Treated to Date: 5
Plan Prescribed Dose Per Fraction: 10 Gy
Plan Prescribed Dose Per Fraction: 10 Gy
Plan Prescribed Dose Per Fraction: 10 Gy
Plan Prescribed Dose Per Fraction: 10 Gy
Plan Total Fractions Prescribed: 5
Plan Total Fractions Prescribed: 5
Plan Total Fractions Prescribed: 5
Plan Total Fractions Prescribed: 5
Plan Total Prescribed Dose: 50 Gy
Plan Total Prescribed Dose: 50 Gy
Plan Total Prescribed Dose: 50 Gy
Plan Total Prescribed Dose: 50 Gy
Reference Point Dosage Given to Date: 50 Gy
Reference Point Dosage Given to Date: 50 Gy
Reference Point Dosage Given to Date: 50 Gy
Reference Point Dosage Given to Date: 50 Gy
Reference Point Session Dosage Given: 10 Gy
Reference Point Session Dosage Given: 10 Gy
Reference Point Session Dosage Given: 10 Gy
Reference Point Session Dosage Given: 10 Gy
Session Number: 5

## 2024-07-26 NOTE — Radiation Completion Notes (Signed)
 Patient Name: Julian Mejia, SANS MRN: [id number] Date of Birth: 05-29-1951 Referring Physician: GAEL REAMS, M.D. Date of Service: 2024-07-26 Radiation Oncologist: Lauraine Golden, M.D. 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 Staging on 2023-12-22: Squamous cell carcinoma of oropharynx (HCC) T=cT4b, N=cN2c, M=cM0 Intent: Palliative     ==========DELIVERED PLANS==========  First Treatment Date: 2024-07-14 Last Treatment Date: 2024-07-24   Plan Name: Lung_LUL_SBRT Site: Lung, Left Technique: SBRT/SRT-IMRT Mode: Photon Dose Per Fraction: 10 Gy Prescribed Dose (Delivered / Prescribed): 50 Gy / 50 Gy Prescribed Fxs (Delivered / Prescribed): 5 / 5   Plan Name: Lng_RUpl_SBRT Site: Lung, Right Technique: SBRT/SRT-IMRT Mode: Photon Dose Per Fraction: 10 Gy Prescribed Dose (Delivered / Prescribed): 50 Gy / 50 Gy Prescribed Fxs (Delivered / Prescribed): 5 / 5   Plan Name: Lung_RML_SBRT Site: Lung, Right Technique: SBRT/SRT-IMRT Mode: Photon Dose Per Fraction: 10 Gy Prescribed Dose (Delivered / Prescribed): 50 Gy / 50 Gy Prescribed Fxs (Delivered / Prescribed): 5 / 5   Plan Name: Lung_RUs_SBRT Site: Lung, Right Technique: SBRT/SRT-IMRT Mode: Photon Dose Per Fraction: 10 Gy Prescribed Dose (Delivered / Prescribed): 50 Gy / 50 Gy Prescribed Fxs (Delivered / Prescribed): 5 / 5     ==========ON TREATMENT VISIT DATES========== 2024-07-14, 2024-07-14, 2024-07-14, 2024-07-14, 2024-07-17, 2024-07-17, 2024-07-17, 2024-07-17, 2024-07-17, 2024-07-19, 2024-07-19, 2024-07-19, 2024-07-19, 2024-07-21, 2024-07-21, 2024-07-21, 2024-07-21, 2024-07-24, 2024-07-24, 2024-07-24,  2024-07-24     ==========UPCOMING VISITS========== 08/17/2024 [facility]-RADIATION ONC FOLLOW UP 20 Golden Lauraine, MD        ==========APPENDIX - ON TREATMENT VISIT NOTES==========   See weekly On Treatment Notes in Epic for details in the Media tab (listed as Progress notes on the On Treatment Visit Dates listed above).

## 2024-08-04 NOTE — Progress Notes (Signed)
 Patient presents for a one month telephone call. He completed radiation treatment for Malignant Neoplasm of the right lung on 07/24/2024.  PAIN: He is currently in no pain.  RESPIRATORY: None. Pt is on room air. Noted ecchymoses - arm(s) right Bruise. Skin is very thin and bruises easily.   SWALLOWING/DIET: Pt drinks water by mouth but uses feeding tube formulas for nutrition. Uses 2 cartons in the morning, one or two in the afternoon. Uses another carton in the evening as tolerated. Flushes feeding tube in between feedings.   OTHER: Pt complains of fatigue and poor appetite.   There were no vitals taken for this visit.   Wt Readings from Last 3 Encounters:  06/20/24 111 lb 12.8 oz (50.7 kg)  06/06/24 112 lb 12.8 oz (51.2 kg)  03/21/24 106 lb (48.1 kg)   Encouraged patient to cal our office for any questions or concerns.

## 2024-08-17 ENCOUNTER — Ambulatory Visit
Admission: RE | Admit: 2024-08-17 | Discharge: 2024-08-17 | Disposition: A | Discharge status: Home / Self Care | Source: Ambulatory Visit | Attending: Radiation Oncology | Admitting: Radiation Oncology

## 2024-08-17 DIAGNOSIS — C3412 Malignant neoplasm of upper lobe, left bronchus or lung: Secondary | ICD-10-CM

## 2024-08-24 ENCOUNTER — Telehealth: Payer: Self-pay | Admitting: *Deleted

## 2024-08-24 NOTE — Telephone Encounter (Signed)
 CALLED PATIENT TO INFORM OF CT FOR 10-30-24- ARRIVAL TIME- 11:30 AM @ [facility], PATIENT TO HAVE WATER ONLY - 6 HRS. PRIOR TO SCAN, PATIENT TO RECEIVE RESULTS FROM PA ELLIE MUIR ON 11-07-24 @ 1 PM, SPOKE WITH PATIENT'S WIFE- [name] AND SHE IS AWARE OF THESE APPTS. AND THE INSTRUCTIONS

## 2024-09-05 ENCOUNTER — Ambulatory Visit: Admitting: Radiology

## 2024-10-26 NOTE — Progress Notes (Incomplete)
 Pain issues, if any: *** Using a feeding tube?: *** Weight changes, if any: *** Swallowing issues, if any: *** Smoking or chewing tobacco? *** Using fluoride toothpaste daily? *** Last ENT visit was on: *** Other notable issues, if any: ***

## 2024-10-30 ENCOUNTER — Encounter (HOSPITAL_COMMUNITY): Admission: RE | Admit: 2024-10-30 | Source: Ambulatory Visit

## 2024-11-02 ENCOUNTER — Encounter (HOSPITAL_COMMUNITY): Admission: RE | Admit: 2024-11-02 | Source: Ambulatory Visit

## 2024-11-03 ENCOUNTER — Ambulatory Visit (HOSPITAL_COMMUNITY): Admission: RE | Admit: 2024-11-03

## 2024-11-07 ENCOUNTER — Ambulatory Visit: Admitting: Radiology

## 2024-11-07 ENCOUNTER — Ambulatory Visit: Admitting: Radiation Oncology

## 2024-11-15 ENCOUNTER — Encounter (HOSPITAL_COMMUNITY)

## 2024-11-17 ENCOUNTER — Ambulatory Visit: Admitting: Radiation Oncology
